# Patient Record
Sex: Male | Born: 1960
Health system: Southern US, Community
[De-identification: ages and names within clinical notes are randomized; demographics above are authoritative.]

## PROBLEM LIST (undated history)

## (undated) DIAGNOSIS — M5020 Other cervical disc displacement, unspecified cervical region: Secondary | ICD-10-CM

## (undated) DIAGNOSIS — M5126 Other intervertebral disc displacement, lumbar region: Secondary | ICD-10-CM

## (undated) DIAGNOSIS — S060X9A Concussion with loss of consciousness of unspecified duration, initial encounter: Secondary | ICD-10-CM

## (undated) HISTORY — DX: Other intervertebral disc displacement, lumbar region: M51.26

## (undated) HISTORY — PX: ROTATOR CUFF REPAIR: SHX139

## (undated) HISTORY — DX: Concussion with loss of consciousness of unspecified duration, initial encounter: S06.0X9A

## (undated) HISTORY — PX: ESOPHAGOGASTRODUODENOSCOPY: SHX1529

## (undated) HISTORY — DX: Other cervical disc displacement, unspecified cervical region: M50.20

## (undated) HISTORY — PX: COLONOSCOPY: SHX174

---

## 2012-06-09 DIAGNOSIS — S060XAA Concussion with loss of consciousness status unknown, initial encounter: Secondary | ICD-10-CM

## 2012-06-09 DIAGNOSIS — S060X9A Concussion with loss of consciousness of unspecified duration, initial encounter: Secondary | ICD-10-CM

## 2012-06-09 HISTORY — DX: Concussion with loss of consciousness status unknown, initial encounter: S06.0XAA

## 2012-06-09 HISTORY — DX: Concussion with loss of consciousness of unspecified duration, initial encounter: S06.0X9A

## 2017-07-03 DIAGNOSIS — Z6832 Body mass index (BMI) 32.0-32.9, adult: Secondary | ICD-10-CM | POA: Diagnosis not present

## 2017-07-03 DIAGNOSIS — R109 Unspecified abdominal pain: Secondary | ICD-10-CM | POA: Diagnosis not present

## 2017-07-03 DIAGNOSIS — K5900 Constipation, unspecified: Secondary | ICD-10-CM | POA: Diagnosis not present

## 2017-07-13 DIAGNOSIS — R7301 Impaired fasting glucose: Secondary | ICD-10-CM | POA: Diagnosis not present

## 2017-07-13 DIAGNOSIS — Z125 Encounter for screening for malignant neoplasm of prostate: Secondary | ICD-10-CM | POA: Diagnosis not present

## 2017-07-13 DIAGNOSIS — R109 Unspecified abdominal pain: Secondary | ICD-10-CM | POA: Diagnosis not present

## 2017-07-13 DIAGNOSIS — Z6832 Body mass index (BMI) 32.0-32.9, adult: Secondary | ICD-10-CM | POA: Diagnosis not present

## 2017-07-21 DIAGNOSIS — R109 Unspecified abdominal pain: Secondary | ICD-10-CM | POA: Diagnosis not present

## 2017-07-21 DIAGNOSIS — R197 Diarrhea, unspecified: Secondary | ICD-10-CM | POA: Diagnosis not present

## 2017-07-27 DIAGNOSIS — R933 Abnormal findings on diagnostic imaging of other parts of digestive tract: Secondary | ICD-10-CM | POA: Diagnosis not present

## 2017-07-27 DIAGNOSIS — N4289 Other specified disorders of prostate: Secondary | ICD-10-CM | POA: Diagnosis not present

## 2017-07-27 DIAGNOSIS — R109 Unspecified abdominal pain: Secondary | ICD-10-CM | POA: Diagnosis not present

## 2017-07-27 DIAGNOSIS — Z6833 Body mass index (BMI) 33.0-33.9, adult: Secondary | ICD-10-CM | POA: Diagnosis not present

## 2017-08-10 ENCOUNTER — Ambulatory Visit (INDEPENDENT_AMBULATORY_CARE_PROVIDER_SITE_OTHER): Payer: Medicare HMO | Admitting: Nurse Practitioner

## 2017-08-10 ENCOUNTER — Encounter: Payer: Self-pay | Admitting: Nurse Practitioner

## 2017-08-10 VITALS — BP 124/78 | HR 72 | Ht 71.0 in | Wt 231.0 lb

## 2017-08-10 DIAGNOSIS — R12 Heartburn: Secondary | ICD-10-CM

## 2017-08-10 DIAGNOSIS — R140 Abdominal distension (gaseous): Secondary | ICD-10-CM

## 2017-08-10 DIAGNOSIS — K5900 Constipation, unspecified: Secondary | ICD-10-CM

## 2017-08-10 NOTE — Patient Instructions (Addendum)
If you are age 565 or older, your body mass index should be between 23-30. Your Body mass index is 32.22 kg/m. If this is out of the aforementioned range listed, please consider follow up with your Primary Care Provider.  If you are age 57 or younger, your body mass index should be between 19-25. Your Body mass index is 32.22 kg/m. If this is out of the aformentioned range listed, please consider follow up with your Primary Care Provider.   You have been given Gas and Bloating literature.  We are requesting  CT results from your primary care physician.  Please bring endo/colon reports with you to your next visit.  Follow up with Willette ClusterPaula Guenther, NP on August 26, 2017 at 9:30 am.  Thank you for choosing me and [facility].   Willette ClusterPaula Guenther, NP
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
## 2017-08-11 ENCOUNTER — Encounter: Payer: Self-pay | Admitting: Nurse Practitioner

## 2017-08-14 DIAGNOSIS — Z6832 Body mass index (BMI) 32.0-32.9, adult: Secondary | ICD-10-CM | POA: Diagnosis not present

## 2017-08-14 DIAGNOSIS — R040 Epistaxis: Secondary | ICD-10-CM | POA: Diagnosis not present

## 2017-08-14 DIAGNOSIS — J310 Chronic rhinitis: Secondary | ICD-10-CM | POA: Diagnosis not present

## 2017-08-26 ENCOUNTER — Encounter: Payer: Self-pay | Admitting: Nurse Practitioner

## 2017-08-26 ENCOUNTER — Ambulatory Visit (INDEPENDENT_AMBULATORY_CARE_PROVIDER_SITE_OTHER): Payer: Medicare HMO | Admitting: Nurse Practitioner

## 2017-08-26 VITALS — BP 122/82 | HR 67 | Ht 70.0 in | Wt 231.0 lb

## 2017-08-26 DIAGNOSIS — K5900 Constipation, unspecified: Secondary | ICD-10-CM | POA: Diagnosis not present

## 2017-08-26 DIAGNOSIS — R140 Abdominal distension (gaseous): Secondary | ICD-10-CM

## 2017-08-26 DIAGNOSIS — R1031 Right lower quadrant pain: Secondary | ICD-10-CM | POA: Diagnosis not present
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
## 2017-08-26 NOTE — Patient Instructions (Addendum)
If you are age 57 or older, your body mass index should be between 23-30. Your Body mass index is 33.15 kg/m. If this is out of the aforementioned range listed, please consider follow up with your Primary Care Provider.  If you are age 57 or younger, your body mass index should be between 19-25. Your Body mass index is 33.15 kg/m. If this is out of the aformentioned range listed, please consider follow up with your Primary Care Provider.   You will be due for a recall colonoscopy in 08/2022. We will send you a reminder in the mail when it gets closer to that time.   Increase your water intake to 8 glasses daily.  Thank you for choosing me and [facility].  Willette ClusterPaula Guenther, NP

## 2017-10-02 ENCOUNTER — Telehealth: Payer: Self-pay | Admitting: Nurse Practitioner

## 2017-10-02 ENCOUNTER — Encounter (HOSPITAL_COMMUNITY): Payer: Self-pay

## 2017-10-02 ENCOUNTER — Emergency Department (HOSPITAL_COMMUNITY)
Admission: EM | Admit: 2017-10-02 | Discharge: 2017-10-02 | Disposition: A | Discharge status: Home / Self Care | Payer: Medicare HMO | Attending: Emergency Medicine | Admitting: Emergency Medicine

## 2017-10-02 ENCOUNTER — Emergency Department (HOSPITAL_COMMUNITY): Payer: Medicare HMO

## 2017-10-02 DIAGNOSIS — R1010 Upper abdominal pain, unspecified: Secondary | ICD-10-CM | POA: Diagnosis not present

## 2017-10-02 DIAGNOSIS — Z79899 Other long term (current) drug therapy: Secondary | ICD-10-CM | POA: Insufficient documentation

## 2017-10-02 DIAGNOSIS — R1011 Right upper quadrant pain: Secondary | ICD-10-CM | POA: Insufficient documentation

## 2017-10-02 DIAGNOSIS — R197 Diarrhea, unspecified: Secondary | ICD-10-CM | POA: Diagnosis not present

## 2017-10-02 DIAGNOSIS — R140 Abdominal distension (gaseous): Secondary | ICD-10-CM | POA: Diagnosis not present

## 2017-10-02 LAB — URINALYSIS, ROUTINE W REFLEX MICROSCOPIC
BILIRUBIN URINE: NEGATIVE
Glucose, UA: NEGATIVE mg/dL
HGB URINE DIPSTICK: NEGATIVE
Ketones, ur: NEGATIVE mg/dL
Leukocytes, UA: NEGATIVE
Nitrite: NEGATIVE
Protein, ur: NEGATIVE mg/dL
Specific Gravity, Urine: 1.02 (ref 1.005–1.030)
pH: 5 (ref 5.0–8.0)

## 2017-10-02 LAB — COMPREHENSIVE METABOLIC PANEL
ALBUMIN: 4 g/dL (ref 3.5–5.0)
ALT: 32 U/L (ref 17–63)
AST: 25 U/L (ref 15–41)
Alkaline Phosphatase: 56 U/L (ref 38–126)
Anion gap: 8 (ref 5–15)
BUN: 13 mg/dL (ref 6–20)
CALCIUM: 9.6 mg/dL (ref 8.9–10.3)
CO2: 25 mmol/L (ref 22–32)
CREATININE: 1.04 mg/dL (ref 0.61–1.24)
Chloride: 107 mmol/L (ref 101–111)
GFR calc Af Amer: >60 mL/min (ref >60)
GFR calc non Af Amer: >60 mL/min (ref >60)
GLUCOSE: 101 mg/dL — AB (ref 65–99)
Potassium: 4.2 mmol/L (ref 3.5–5.1)
Sodium: 140 mmol/L (ref 135–145)
Total Bilirubin: 1 mg/dL (ref 0.3–1.2)
Total Protein: 7.2 g/dL (ref 6.5–8.1)

## 2017-10-02 LAB — CBC
HCT: 41.5 % (ref 39.0–52.0)
HEMOGLOBIN: 13.8 g/dL (ref 13.0–17.0)
MCH: 27.6 pg (ref 26.0–34.0)
MCHC: 33.3 g/dL (ref 30.0–36.0)
MCV: 83 fL (ref 78.0–100.0)
Platelets: 277 10*3/uL (ref 150–400)
RBC: 5 MIL/uL (ref 4.22–5.81)
RDW: 14.5 % (ref 11.5–15.5)
WBC: 6.4 10*3/uL (ref 4.0–10.5)

## 2017-10-02 LAB — LIPASE, BLOOD: LIPASE: 45 U/L (ref 11–51)

## 2017-10-02 NOTE — ED Notes (Signed)
Patient Alert and oriented to baseline. Stable and ambulatory to baseline. Patient verbalized understanding of the discharge instructions.  Patient belongings were taken by the patient.   

## 2017-10-02 NOTE — Discharge Instructions (Signed)
It was my pleasure taking care of you today!  ° °Fortunately, your lab work and imaging was very reassuring today.  °At this time, there does not appear to be an acute, emergent cause for your abdominal pain. However, this does not mean that your abdominal pain may not become an emergency in the future. It is VERY important that you monitor your symptoms and return to the Emergency Department if you develop any of the following symptoms:  °You have a fever.  °You keep throwing up and can't keep fluids down.  °You pass bloody or black tarry stools.  °There is bright red blood in the stool. °You do not seem to be getting better.  °You have any questions or concerns.  °

## 2017-10-02 NOTE — ED Notes (Signed)
Patient transported to [facility] 

## 2017-10-02 NOTE — ED Triage Notes (Signed)
Patient here with a few months of right sided abdominal pain with change in stool habits, reports intermittent pain and diarrhea. No nausea, no vomiting

## 2017-10-02 NOTE — ED Notes (Addendum)
Pt returned from [facility]. No acute distress noted.

## 2017-10-02 NOTE — Telephone Encounter (Signed)
Patient is currently in the ED.
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
## 2017-10-13 DIAGNOSIS — R109 Unspecified abdominal pain: Secondary | ICD-10-CM | POA: Diagnosis not present

## 2017-10-13 DIAGNOSIS — Z6832 Body mass index (BMI) 32.0-32.9, adult: Secondary | ICD-10-CM | POA: Diagnosis not present

## 2017-11-04 ENCOUNTER — Telehealth: Payer: Self-pay

## 2017-11-04 NOTE — Telephone Encounter (Signed)
Forwarded to a phone note. gm

## 2017-11-04 NOTE — Telephone Encounter (Signed)
-----   Message from Meredith Pel, NP sent at 11/03/2017 10:50 AM EDT ----- Regarding: FW: Patient of yours seen in ED today. Malachi Bonds, can you turn this note from ED into a phone note. Thanks ----- Message ----- From: Janyth Contes, PA-C Sent: 10/02/2017   8:34 PM To: Meredith Pel, NP Subject: Patient of yours seen in ED today.             Hello!   Just wanted to let you know that I saw this patient of yours in the ER today for continued symptoms without any acute changes other than one white stool. He was pretty convinced this was due to his gallbladder, so I did get an ultrasound and gallbladder was fine. Asked him to follow up with you guys in clinic. He wanted me to pass along the message that he had been seen in ED.   Thanks,   Elizabeth Sauer, PA-C  Emergency Medicine

## 2017-12-28 DIAGNOSIS — R079 Chest pain, unspecified: Secondary | ICD-10-CM | POA: Diagnosis not present

## 2017-12-28 DIAGNOSIS — Z6833 Body mass index (BMI) 33.0-33.9, adult: Secondary | ICD-10-CM | POA: Diagnosis not present

## 2017-12-29 DIAGNOSIS — R079 Chest pain, unspecified: Secondary | ICD-10-CM | POA: Diagnosis not present

## 2018-01-06 DIAGNOSIS — I493 Ventricular premature depolarization: Secondary | ICD-10-CM | POA: Diagnosis not present

## 2018-01-06 DIAGNOSIS — Z13220 Encounter for screening for lipoid disorders: Secondary | ICD-10-CM | POA: Diagnosis not present

## 2018-01-06 DIAGNOSIS — Z6832 Body mass index (BMI) 32.0-32.9, adult: Secondary | ICD-10-CM | POA: Diagnosis not present

## 2018-01-06 DIAGNOSIS — R079 Chest pain, unspecified: Secondary | ICD-10-CM | POA: Diagnosis not present

## 2018-01-13 DIAGNOSIS — I201 Angina pectoris with documented spasm: Secondary | ICD-10-CM | POA: Diagnosis not present

## 2018-01-14 DIAGNOSIS — R079 Chest pain, unspecified: Secondary | ICD-10-CM | POA: Diagnosis not present

## 2018-02-05 DIAGNOSIS — Z6832 Body mass index (BMI) 32.0-32.9, adult: Secondary | ICD-10-CM | POA: Diagnosis not present

## 2018-02-05 DIAGNOSIS — M5003 Cervical disc disorder with myelopathy, cervicothoracic region: Secondary | ICD-10-CM | POA: Diagnosis not present

## 2018-02-05 DIAGNOSIS — I471 Supraventricular tachycardia: Secondary | ICD-10-CM | POA: Diagnosis not present

## 2018-02-05 DIAGNOSIS — M5020 Other cervical disc displacement, unspecified cervical region: Secondary | ICD-10-CM | POA: Diagnosis not present

## 2018-03-05 DIAGNOSIS — J309 Allergic rhinitis, unspecified: Secondary | ICD-10-CM | POA: Diagnosis not present

## 2018-03-05 DIAGNOSIS — I471 Supraventricular tachycardia: Secondary | ICD-10-CM | POA: Diagnosis not present

## 2018-03-05 DIAGNOSIS — Z6832 Body mass index (BMI) 32.0-32.9, adult: Secondary | ICD-10-CM | POA: Diagnosis not present

## 2018-03-25 DIAGNOSIS — N401 Enlarged prostate with lower urinary tract symptoms: Secondary | ICD-10-CM | POA: Diagnosis not present

## 2018-03-25 DIAGNOSIS — N4283 Cyst of prostate: Secondary | ICD-10-CM | POA: Diagnosis not present

## 2018-04-15 DIAGNOSIS — J019 Acute sinusitis, unspecified: Secondary | ICD-10-CM | POA: Diagnosis not present

## 2018-06-09 DIAGNOSIS — H10029 Other mucopurulent conjunctivitis, unspecified eye: Secondary | ICD-10-CM | POA: Diagnosis not present

## 2018-07-06 DIAGNOSIS — J019 Acute sinusitis, unspecified: Secondary | ICD-10-CM | POA: Diagnosis not present

## 2018-07-06 DIAGNOSIS — Z6832 Body mass index (BMI) 32.0-32.9, adult: Secondary | ICD-10-CM | POA: Diagnosis not present

## 2018-07-06 DIAGNOSIS — J069 Acute upper respiratory infection, unspecified: Secondary | ICD-10-CM | POA: Diagnosis not present

## 2018-07-06 DIAGNOSIS — R05 Cough: Secondary | ICD-10-CM | POA: Diagnosis not present

## 2018-08-06 DIAGNOSIS — M549 Dorsalgia, unspecified: Secondary | ICD-10-CM | POA: Diagnosis not present

## 2018-08-06 DIAGNOSIS — Z1331 Encounter for screening for depression: Secondary | ICD-10-CM | POA: Diagnosis not present

## 2018-08-06 DIAGNOSIS — M542 Cervicalgia: Secondary | ICD-10-CM | POA: Diagnosis not present

## 2018-08-06 DIAGNOSIS — G8929 Other chronic pain: Secondary | ICD-10-CM | POA: Diagnosis not present

## 2018-11-26 DIAGNOSIS — Z6833 Body mass index (BMI) 33.0-33.9, adult: Secondary | ICD-10-CM | POA: Diagnosis not present

## 2018-11-26 DIAGNOSIS — W57XXXA Bitten or stung by nonvenomous insect and other nonvenomous arthropods, initial encounter: Secondary | ICD-10-CM | POA: Diagnosis not present

## 2018-11-26 DIAGNOSIS — M79605 Pain in left leg: Secondary | ICD-10-CM | POA: Diagnosis not present

## 2019-01-11 ENCOUNTER — Encounter: Payer: Self-pay | Admitting: Nurse Practitioner

## 2019-01-11 ENCOUNTER — Ambulatory Visit (INDEPENDENT_AMBULATORY_CARE_PROVIDER_SITE_OTHER): Payer: Medicare HMO | Admitting: Nurse Practitioner

## 2019-01-11 VITALS — BP 110/70 | HR 85 | Ht 71.0 in | Wt 238.0 lb

## 2019-01-11 DIAGNOSIS — K5900 Constipation, unspecified: Secondary | ICD-10-CM | POA: Diagnosis not present

## 2019-01-11 DIAGNOSIS — R109 Unspecified abdominal pain: Secondary | ICD-10-CM | POA: Diagnosis not present

## 2019-01-11 DIAGNOSIS — R140 Abdominal distension (gaseous): Secondary | ICD-10-CM

## 2019-01-11 DIAGNOSIS — R5383 Other fatigue: Secondary | ICD-10-CM | POA: Diagnosis not present

## 2019-01-11 MED ORDER — NA SULFATE-K SULFATE-MG SULF 17.5-3.13-1.6 GM/177ML PO SOLN: 1.0000 | Freq: Once | ORAL | 0 refills | Status: AC
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
## 2019-01-11 NOTE — Patient Instructions (Addendum)
If you are age 58 or older, your body mass index should be between 23-30. Your Body mass index is 33.19 kg/m. If this is out of the aforementioned range listed, please consider follow up with your Primary Care Provider.  If you are age 72 or younger, your body mass index should be between 19-25. Your Body mass index is 33.19 kg/m. If this is out of the aformentioned range listed, please consider follow up with your Primary Care Provider.   Increase your water intake to 48-64 ounces a day, take metamucil daily. You have been scheduled for a colonoscopy. Please follow written instructions given to you at your visit today.  Please pick up your prep supplies at the pharmacy within the next 1-3 days. If you use inhalers (even only as needed), please bring them with you on the day of your procedure.  We have sent the following medications to your pharmacy for you to pick up at your convenience:  Suprep  Hold your iron supplement 5 days prior to your procedure.  Thank you for choosing me and [facility].  Tye Savoy, NP

## 2019-01-14 ENCOUNTER — Telehealth: Payer: Self-pay | Admitting: Internal Medicine

## 2019-01-14 NOTE — Telephone Encounter (Signed)
Noted  

## 2019-01-14 NOTE — Telephone Encounter (Signed)
FYI: [facility] called and said patient canceled his procedure because he cannot afford right now.  His insurance will not cover the procedure and he would like to try the meds first

## 2019-01-27 ENCOUNTER — Encounter: Payer: Medicare HMO | Admitting: Internal Medicine

## 2019-02-08 ENCOUNTER — Emergency Department (HOSPITAL_COMMUNITY)
Admission: EM | Admit: 2019-02-08 | Discharge: 2019-02-09 | Disposition: A | Discharge status: Home / Self Care | Payer: Medicare HMO | Attending: Emergency Medicine | Admitting: Emergency Medicine

## 2019-02-08 ENCOUNTER — Other Ambulatory Visit: Payer: Self-pay

## 2019-02-08 DIAGNOSIS — R42 Dizziness and giddiness: Secondary | ICD-10-CM | POA: Diagnosis not present

## 2019-02-08 DIAGNOSIS — R55 Syncope and collapse: Secondary | ICD-10-CM | POA: Diagnosis not present

## 2019-02-08 LAB — DIFFERENTIAL
Abs Immature Granulocytes: 0.03 10*3/uL (ref 0.00–0.07)
Basophils Absolute: 0 10*3/uL (ref 0.0–0.1)
Basophils Relative: 0 %
Eosinophils Absolute: 0.3 10*3/uL (ref 0.0–0.5)
Eosinophils Relative: 3 %
Immature Granulocytes: 0 %
Lymphocytes Relative: 21 %
Lymphs Abs: 1.9 10*3/uL (ref 0.7–4.0)
Monocytes Absolute: 0.7 10*3/uL (ref 0.1–1.0)
Monocytes Relative: 7 %
Neutro Abs: 6 10*3/uL (ref 1.7–7.7)
Neutrophils Relative %: 69 %

## 2019-02-08 LAB — I-STAT CHEM 8, ED
BUN: 16 mg/dL (ref 6–20)
Calcium, Ion: 0.98 mmol/L — ABNORMAL LOW (ref 1.15–1.40)
Chloride: 107 mmol/L (ref 98–111)
Creatinine, Ser: 1 mg/dL (ref 0.61–1.24)
Glucose, Bld: 122 mg/dL — ABNORMAL HIGH (ref 70–99)
HCT: 48 % (ref 39.0–52.0)
Hemoglobin: 16.3 g/dL (ref 13.0–17.0)
Potassium: 4.1 mmol/L (ref 3.5–5.1)
Sodium: 138 mmol/L (ref 135–145)
TCO2: 21 mmol/L — ABNORMAL LOW (ref 22–32)

## 2019-02-08 LAB — COMPREHENSIVE METABOLIC PANEL
ALT: 38 U/L (ref 0–44)
AST: 29 U/L (ref 15–41)
Albumin: 4.5 g/dL (ref 3.5–5.0)
Alkaline Phosphatase: 54 U/L (ref 38–126)
Anion gap: 11 (ref 5–15)
BUN: 13 mg/dL (ref 6–20)
CO2: 24 mmol/L (ref 22–32)
Calcium: 10.1 mg/dL (ref 8.9–10.3)
Chloride: 105 mmol/L (ref 98–111)
Creatinine, Ser: 1.04 mg/dL (ref 0.61–1.24)
GFR calc Af Amer: >60 mL/min (ref >60)
GFR calc non Af Amer: >60 mL/min (ref >60)
Glucose, Bld: 123 mg/dL — ABNORMAL HIGH (ref 70–99)
Potassium: 4.3 mmol/L (ref 3.5–5.1)
Sodium: 140 mmol/L (ref 135–145)
Total Bilirubin: 1.5 mg/dL — ABNORMAL HIGH (ref 0.3–1.2)
Total Protein: 8.1 g/dL (ref 6.5–8.1)

## 2019-02-08 LAB — CBC
HCT: 45.2 % (ref 39.0–52.0)
Hemoglobin: 14.7 g/dL (ref 13.0–17.0)
MCH: 27.6 pg (ref 26.0–34.0)
MCHC: 32.5 g/dL (ref 30.0–36.0)
MCV: 84.8 fL (ref 80.0–100.0)
Platelets: 244 10*3/uL (ref 150–400)
RBC: 5.33 MIL/uL (ref 4.22–5.81)
RDW: 14 % (ref 11.5–15.5)
WBC: 8.9 10*3/uL (ref 4.0–10.5)
nRBC: 0 % (ref 0.0–0.2)

## 2019-02-08 LAB — APTT: aPTT: 27 seconds (ref 24–36)

## 2019-02-08 LAB — PROTIME-INR
INR: 1.1 (ref 0.8–1.2)
Prothrombin Time: 13.7 seconds (ref 11.4–15.2)

## 2019-02-08 MED ORDER — SODIUM CHLORIDE 0.9% FLUSH
3.0000 mL | Freq: Once | INTRAVENOUS | Status: AC
Start: 2019-02-08 — End: 2019-02-09
  Administered 2019-02-09: 3 mL via INTRAVENOUS

## 2019-02-08 NOTE — ED Triage Notes (Signed)
To ED for eval of dizziness since last pm. Dizziness happens when pt lays down or looks to the ground. Pt was able to drive self to the hospital. States this am when having BM he had episode of vomiting. Denies sob or cp.

## 2019-02-09 MED ORDER — LACTATED RINGERS IV BOLUS
1000.0000 mL | Freq: Once | INTRAVENOUS | Status: AC
  Administered 2019-02-09: 1000 mL via INTRAVENOUS

## 2019-02-09 MED ORDER — MECLIZINE HCL 25 MG PO TABS: 25.0000 mg | ORAL_TABLET | Freq: Three times a day (TID) | ORAL | 0 refills | Status: DC | PRN

## 2019-02-09 MED ORDER — MECLIZINE HCL 25 MG PO TABS
25.0000 mg | ORAL_TABLET | Freq: Once | ORAL | Status: AC
Start: 2019-02-09 — End: 2019-02-09
  Administered 2019-02-09: 25 mg via ORAL
  Filled 2019-02-09: qty 1

## 2019-02-09 MED ORDER — PREDNISONE 20 MG PO TABS
60.0000 mg | ORAL_TABLET | Freq: Once | ORAL | Status: AC
  Administered 2019-02-09: 60 mg via ORAL
  Filled 2019-02-09: qty 3

## 2019-02-09 MED ORDER — PREDNISONE 20 MG PO TABS: ORAL_TABLET | ORAL | 0 refills | Status: DC

## 2019-02-09 NOTE — ED Notes (Signed)
Family at bedside. 
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
## 2019-03-25 DIAGNOSIS — Z1339 Encounter for screening examination for other mental health and behavioral disorders: Secondary | ICD-10-CM | POA: Diagnosis not present

## 2019-03-25 DIAGNOSIS — Z6833 Body mass index (BMI) 33.0-33.9, adult: Secondary | ICD-10-CM | POA: Diagnosis not present

## 2019-03-25 DIAGNOSIS — Z7189 Other specified counseling: Secondary | ICD-10-CM | POA: Diagnosis not present

## 2019-03-25 DIAGNOSIS — Z0000 Encounter for general adult medical examination without abnormal findings: Secondary | ICD-10-CM | POA: Diagnosis not present

## 2019-03-25 DIAGNOSIS — Z1211 Encounter for screening for malignant neoplasm of colon: Secondary | ICD-10-CM | POA: Diagnosis not present

## 2019-03-25 DIAGNOSIS — Z139 Encounter for screening, unspecified: Secondary | ICD-10-CM | POA: Diagnosis not present

## 2019-03-25 DIAGNOSIS — Z1331 Encounter for screening for depression: Secondary | ICD-10-CM | POA: Diagnosis not present

## 2019-03-25 DIAGNOSIS — Z125 Encounter for screening for malignant neoplasm of prostate: Secondary | ICD-10-CM | POA: Diagnosis not present

## 2019-03-25 DIAGNOSIS — Z13220 Encounter for screening for lipoid disorders: Secondary | ICD-10-CM | POA: Diagnosis not present

## 2019-03-25 DIAGNOSIS — Z131 Encounter for screening for diabetes mellitus: Secondary | ICD-10-CM | POA: Diagnosis not present

## 2019-04-08 DIAGNOSIS — R7303 Prediabetes: Secondary | ICD-10-CM | POA: Diagnosis not present

## 2019-04-08 DIAGNOSIS — Z6833 Body mass index (BMI) 33.0-33.9, adult: Secondary | ICD-10-CM | POA: Diagnosis not present

## 2019-04-08 DIAGNOSIS — R7301 Impaired fasting glucose: Secondary | ICD-10-CM | POA: Diagnosis not present

## 2019-04-08 DIAGNOSIS — E785 Hyperlipidemia, unspecified: Secondary | ICD-10-CM | POA: Diagnosis not present

## 2019-04-12 DIAGNOSIS — R7303 Prediabetes: Secondary | ICD-10-CM | POA: Diagnosis not present

## 2019-04-24 ENCOUNTER — Other Ambulatory Visit: Payer: Self-pay | Admitting: Cardiology

## 2019-04-24 DIAGNOSIS — Z20822 Contact with and (suspected) exposure to covid-19: Secondary | ICD-10-CM

## 2019-04-26 DIAGNOSIS — Z6833 Body mass index (BMI) 33.0-33.9, adult: Secondary | ICD-10-CM | POA: Diagnosis not present

## 2019-04-26 DIAGNOSIS — Z6832 Body mass index (BMI) 32.0-32.9, adult: Secondary | ICD-10-CM | POA: Diagnosis not present

## 2019-04-26 LAB — NOVEL CORONAVIRUS, NAA: SARS-CoV-2, NAA: NOT DETECTED

## 2019-05-03 DIAGNOSIS — Z6832 Body mass index (BMI) 32.0-32.9, adult: Secondary | ICD-10-CM | POA: Diagnosis not present

## 2019-05-17 DIAGNOSIS — E669 Obesity, unspecified: Secondary | ICD-10-CM | POA: Diagnosis not present

## 2019-05-31 DIAGNOSIS — E785 Hyperlipidemia, unspecified: Secondary | ICD-10-CM | POA: Diagnosis not present

## 2019-05-31 DIAGNOSIS — Z6832 Body mass index (BMI) 32.0-32.9, adult: Secondary | ICD-10-CM | POA: Diagnosis not present

## 2019-05-31 DIAGNOSIS — E669 Obesity, unspecified: Secondary | ICD-10-CM | POA: Diagnosis not present

## 2019-05-31 DIAGNOSIS — R7303 Prediabetes: Secondary | ICD-10-CM | POA: Diagnosis not present

## 2019-06-28 DIAGNOSIS — Z6832 Body mass index (BMI) 32.0-32.9, adult: Secondary | ICD-10-CM | POA: Diagnosis not present

## 2019-06-28 DIAGNOSIS — E669 Obesity, unspecified: Secondary | ICD-10-CM | POA: Diagnosis not present

## 2019-07-12 DIAGNOSIS — E669 Obesity, unspecified: Secondary | ICD-10-CM | POA: Diagnosis not present

## 2019-07-19 DIAGNOSIS — H5203 Hypermetropia, bilateral: Secondary | ICD-10-CM | POA: Diagnosis not present

## 2019-07-19 DIAGNOSIS — H52209 Unspecified astigmatism, unspecified eye: Secondary | ICD-10-CM | POA: Diagnosis not present

## 2019-07-19 DIAGNOSIS — H524 Presbyopia: Secondary | ICD-10-CM | POA: Diagnosis not present

## 2019-08-12 DIAGNOSIS — R0789 Other chest pain: Secondary | ICD-10-CM | POA: Diagnosis not present

## 2019-09-27 DIAGNOSIS — R519 Headache, unspecified: Secondary | ICD-10-CM | POA: Diagnosis not present

## 2019-09-27 DIAGNOSIS — R509 Fever, unspecified: Secondary | ICD-10-CM | POA: Diagnosis not present

## 2019-09-27 DIAGNOSIS — M791 Myalgia, unspecified site: Secondary | ICD-10-CM | POA: Diagnosis not present

## 2019-09-29 DIAGNOSIS — U071 COVID-19: Secondary | ICD-10-CM

## 2019-09-29 HISTORY — DX: COVID-19: U07.1

## 2019-10-04 ENCOUNTER — Emergency Department (HOSPITAL_COMMUNITY): Payer: Medicare Other

## 2019-10-04 ENCOUNTER — Inpatient Hospital Stay (HOSPITAL_COMMUNITY)
Admission: EM | Admit: 2019-10-04 | Discharge: 2019-10-09 | DRG: 177 | Disposition: A | Discharge status: Home / Self Care | Payer: Medicare Other | Attending: Internal Medicine | Admitting: Internal Medicine

## 2019-10-04 ENCOUNTER — Other Ambulatory Visit: Payer: Self-pay

## 2019-10-04 DIAGNOSIS — Z8371 Family history of colonic polyps: Secondary | ICD-10-CM | POA: Diagnosis not present

## 2019-10-04 DIAGNOSIS — Z87820 Personal history of traumatic brain injury: Secondary | ICD-10-CM

## 2019-10-04 DIAGNOSIS — Z8249 Family history of ischemic heart disease and other diseases of the circulatory system: Secondary | ICD-10-CM | POA: Diagnosis not present

## 2019-10-04 DIAGNOSIS — I959 Hypotension, unspecified: Secondary | ICD-10-CM | POA: Diagnosis present

## 2019-10-04 DIAGNOSIS — Z791 Long term (current) use of non-steroidal anti-inflammatories (NSAID): Secondary | ICD-10-CM

## 2019-10-04 DIAGNOSIS — Z8042 Family history of malignant neoplasm of prostate: Secondary | ICD-10-CM

## 2019-10-04 DIAGNOSIS — R0602 Shortness of breath: Secondary | ICD-10-CM | POA: Diagnosis not present

## 2019-10-04 DIAGNOSIS — U071 COVID-19: Secondary | ICD-10-CM | POA: Diagnosis not present

## 2019-10-04 DIAGNOSIS — Z79899 Other long term (current) drug therapy: Secondary | ICD-10-CM

## 2019-10-04 DIAGNOSIS — J189 Pneumonia, unspecified organism: Secondary | ICD-10-CM

## 2019-10-04 DIAGNOSIS — G4733 Obstructive sleep apnea (adult) (pediatric): Secondary | ICD-10-CM | POA: Diagnosis present

## 2019-10-04 DIAGNOSIS — Z9119 Patient's noncompliance with other medical treatment and regimen: Secondary | ICD-10-CM | POA: Diagnosis not present

## 2019-10-04 DIAGNOSIS — Z7952 Long term (current) use of systemic steroids: Secondary | ICD-10-CM | POA: Diagnosis not present

## 2019-10-04 DIAGNOSIS — Z87891 Personal history of nicotine dependence: Secondary | ICD-10-CM

## 2019-10-04 DIAGNOSIS — J1282 Pneumonia due to coronavirus disease 2019: Secondary | ICD-10-CM | POA: Diagnosis not present

## 2019-10-04 DIAGNOSIS — Z880 Allergy status to penicillin: Secondary | ICD-10-CM

## 2019-10-04 DIAGNOSIS — E861 Hypovolemia: Secondary | ICD-10-CM | POA: Diagnosis present

## 2019-10-04 DIAGNOSIS — R197 Diarrhea, unspecified: Secondary | ICD-10-CM | POA: Diagnosis not present

## 2019-10-04 NOTE — ED Triage Notes (Signed)
Was around someone on 4-4 that was Covid +, a few days later pt started getting sinus symptoms and then started feeling worse.  Has been taking herbal remedies but feels he needs antibiotic for pneumonia.  Shortness of breath is worsening.

## 2019-10-05 ENCOUNTER — Encounter (HOSPITAL_COMMUNITY): Payer: Self-pay | Admitting: Internal Medicine

## 2019-10-05 ENCOUNTER — Other Ambulatory Visit: Payer: Self-pay

## 2019-10-05 ENCOUNTER — Emergency Department (HOSPITAL_COMMUNITY): Payer: Medicare Other

## 2019-10-05 DIAGNOSIS — J1282 Pneumonia due to coronavirus disease 2019: Secondary | ICD-10-CM | POA: Diagnosis present

## 2019-10-05 DIAGNOSIS — U071 COVID-19: Secondary | ICD-10-CM | POA: Diagnosis present

## 2019-10-05 LAB — FIBRINOGEN: Fibrinogen: 632 mg/dL — ABNORMAL HIGH (ref 210–475)

## 2019-10-05 LAB — CBC
HCT: 44.2 % (ref 39.0–52.0)
Hemoglobin: 14.2 g/dL (ref 13.0–17.0)
MCH: 26.7 pg (ref 26.0–34.0)
MCHC: 32.1 g/dL (ref 30.0–36.0)
MCV: 83.2 fL (ref 80.0–100.0)
Platelets: 221 10*3/uL (ref 150–400)
RBC: 5.31 MIL/uL (ref 4.22–5.81)
RDW: 14 % (ref 11.5–15.5)
WBC: 4.1 10*3/uL (ref 4.0–10.5)
nRBC: 0 % (ref 0.0–0.2)

## 2019-10-05 LAB — RESPIRATORY PANEL BY RT PCR (FLU A&B, COVID)
Influenza A by PCR: NEGATIVE
Influenza B by PCR: NEGATIVE
SARS Coronavirus 2 by RT PCR: POSITIVE — AB

## 2019-10-05 LAB — BASIC METABOLIC PANEL
Anion gap: 11 (ref 5–15)
Anion gap: 11 (ref 5–15)
BUN: 12 mg/dL (ref 6–20)
BUN: 13 mg/dL (ref 6–20)
CO2: 24 mmol/L (ref 22–32)
CO2: 25 mmol/L (ref 22–32)
Calcium: 8.7 mg/dL — ABNORMAL LOW (ref 8.9–10.3)
Calcium: 9.3 mg/dL (ref 8.9–10.3)
Chloride: 102 mmol/L (ref 98–111)
Chloride: 103 mmol/L (ref 98–111)
Creatinine, Ser: 1.03 mg/dL (ref 0.61–1.24)
Creatinine, Ser: 1.16 mg/dL (ref 0.61–1.24)
GFR calc Af Amer: >60 mL/min (ref >60)
GFR calc Af Amer: >60 mL/min (ref >60)
GFR calc non Af Amer: >60 mL/min (ref >60)
GFR calc non Af Amer: >60 mL/min (ref >60)
Glucose, Bld: 115 mg/dL — ABNORMAL HIGH (ref 70–99)
Glucose, Bld: 128 mg/dL — ABNORMAL HIGH (ref 70–99)
Potassium: 4.2 mmol/L (ref 3.5–5.1)
Potassium: 4.7 mmol/L (ref 3.5–5.1)
Sodium: 138 mmol/L (ref 135–145)
Sodium: 138 mmol/L (ref 135–145)

## 2019-10-05 LAB — LACTIC ACID, PLASMA
Lactic Acid, Venous: 1.4 mmol/L (ref 0.5–1.9)
Lactic Acid, Venous: 2.7 mmol/L (ref 0.5–1.9)

## 2019-10-05 LAB — CBG MONITORING, ED: Glucose-Capillary: 93 mg/dL (ref 70–99)

## 2019-10-05 LAB — C-REACTIVE PROTEIN: CRP: 4.8 mg/dL — ABNORMAL HIGH (ref <1.0)

## 2019-10-05 LAB — PROCALCITONIN: Procalcitonin: <0.1 ng/mL

## 2019-10-05 LAB — HIV ANTIBODY (ROUTINE TESTING W REFLEX): HIV Screen 4th Generation wRfx: NONREACTIVE

## 2019-10-05 LAB — LACTATE DEHYDROGENASE: LDH: 419 U/L — ABNORMAL HIGH (ref 98–192)

## 2019-10-05 LAB — FERRITIN: Ferritin: 1093 ng/mL — ABNORMAL HIGH (ref 24–336)

## 2019-10-05 LAB — TRIGLYCERIDES: Triglycerides: 132 mg/dL (ref <150)

## 2019-10-05 LAB — D-DIMER, QUANTITATIVE: D-Dimer, Quant: 1.09 ug/mL-FEU — ABNORMAL HIGH (ref 0.00–0.50)

## 2019-10-05 MED ORDER — ENOXAPARIN SODIUM 40 MG/0.4ML ~~LOC~~ SOLN
40.0000 mg | SUBCUTANEOUS | Status: DC
  Administered 2019-10-05 – 2019-10-08 (×4): 40 mg via SUBCUTANEOUS
  Filled 2019-10-05 (×4): qty 0.4

## 2019-10-05 MED ORDER — SENNOSIDES-DOCUSATE SODIUM 8.6-50 MG PO TABS: 1.0000 | ORAL_TABLET | Freq: Every evening | ORAL | Status: DC | PRN

## 2019-10-05 MED ORDER — ENOXAPARIN SODIUM 40 MG/0.4ML ~~LOC~~ SOLN: 40.0000 mg | SUBCUTANEOUS | Status: DC

## 2019-10-05 MED ORDER — SODIUM CHLORIDE 0.9 % IV SOLN: INTRAVENOUS | Status: DC | PRN

## 2019-10-05 MED ORDER — GUAIFENESIN-DM 100-10 MG/5ML PO SYRP
10.0000 mL | ORAL_SOLUTION | ORAL | Status: DC | PRN
  Filled 2019-10-05: qty 10

## 2019-10-05 MED ORDER — ACETAMINOPHEN 650 MG RE SUPP: 650.0000 mg | Freq: Four times a day (QID) | RECTAL | Status: DC | PRN

## 2019-10-05 MED ORDER — SODIUM CHLORIDE 0.9 % IV SOLN
200.0000 mg | Freq: Once | INTRAVENOUS | Status: DC
  Filled 2019-10-05: qty 40

## 2019-10-05 MED ORDER — SODIUM CHLORIDE 0.9 % IV BOLUS
1000.0000 mL | Freq: Once | INTRAVENOUS | Status: AC
  Administered 2019-10-05: 1000 mL via INTRAVENOUS

## 2019-10-05 MED ORDER — PROMETHAZINE HCL 25 MG PO TABS
12.5000 mg | ORAL_TABLET | Freq: Four times a day (QID) | ORAL | Status: DC | PRN
  Administered 2019-10-06: 12.5 mg via ORAL
  Filled 2019-10-05 (×2): qty 1

## 2019-10-05 MED ORDER — PROMETHAZINE HCL 25 MG PO TABS: 12.5000 mg | ORAL_TABLET | Freq: Four times a day (QID) | ORAL | Status: DC | PRN

## 2019-10-05 MED ORDER — ACETAMINOPHEN 325 MG PO TABS: 650.0000 mg | ORAL_TABLET | Freq: Four times a day (QID) | ORAL | Status: DC | PRN

## 2019-10-05 MED ORDER — ACETAMINOPHEN 325 MG PO TABS
650.0000 mg | ORAL_TABLET | Freq: Four times a day (QID) | ORAL | Status: DC | PRN
  Administered 2019-10-06: 650 mg via ORAL
  Filled 2019-10-05: qty 2

## 2019-10-05 MED ORDER — HYDROCOD POLST-CPM POLST ER 10-8 MG/5ML PO SUER
5.0000 mL | Freq: Two times a day (BID) | ORAL | Status: DC | PRN
  Filled 2019-10-05: qty 5

## 2019-10-05 MED ORDER — SODIUM CHLORIDE 0.9 % IV SOLN
200.0000 mg | Freq: Once | INTRAVENOUS | Status: AC
  Administered 2019-10-05: 200 mg via INTRAVENOUS
  Filled 2019-10-05: qty 40

## 2019-10-05 MED ORDER — GUAIFENESIN-DM 100-10 MG/5ML PO SYRP: 10.0000 mL | ORAL_SOLUTION | ORAL | Status: DC | PRN

## 2019-10-05 MED ORDER — HYDROCOD POLST-CPM POLST ER 10-8 MG/5ML PO SUER: 5.0000 mL | Freq: Two times a day (BID) | ORAL | Status: DC | PRN

## 2019-10-05 MED ORDER — SODIUM CHLORIDE 0.9 % IV SOLN: 100.0000 mg | Freq: Every day | INTRAVENOUS | Status: DC
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
## 2019-10-05 NOTE — ED Notes (Signed)
Pt complaining of suddenly feeling weak and nauseated. Sates that sometimes his sugar drops.

## 2019-10-05 NOTE — ED Notes (Signed)
POt ambulated on room air, pt felt somewhat short of breath upon returning to room. Sats remained in low 90's upper 80's. Upon returning to bed pts sats were 88% on room air. Josh PA notified.

## 2019-10-05 NOTE — ED Notes (Signed)
Just while getting pt up from wheelchair and undressed pt was short of breath. Pt was 89% on the monitor and after taking some deep breathes in the bed he increased to 93%.
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
## 2019-10-05 NOTE — ED Notes (Signed)
CBG 93 

## 2019-10-05 NOTE — Progress Notes (Signed)
CRITICAL VALUE ALERT  Critical Value:  Lactic Acid 2.7  Date & Time Notied:  10/05/2019 at 2059  Provider Notified: Lanier,MD at 2100  Orders Received/Actions taken: MD placed order for 1,062mL NaCl bolus and orders for a lactic acid and BMP blood draw. Patient's RN Joni Reining made aware of this.

## 2019-10-05 NOTE — ED Notes (Signed)
Dr. Delma Officer at bedside.

## 2019-10-05 NOTE — ED Notes (Signed)
Per Dr. Delma Officer pt to be admitted per family demand.

## 2019-10-05 NOTE — ED Notes (Signed)
Attempted to call report

## 2019-10-05 NOTE — Discharge Planning (Signed)
RNCM contacted [facility] to refer pt for home services.

## 2019-10-05 NOTE — ED Notes (Signed)
Ambulated Pt in the RM. Pt started at 93% and stayed between 89%-93% the whole time. Pt took 10 laps around his room.

## 2019-10-05 NOTE — ED Notes (Signed)
Josh PA at bedside   

## 2019-10-05 NOTE — Progress Notes (Signed)
Joe Harris is a 59 y.o. male patient admitted from ED awake, alert - oriented  X 4 - no acute distress noted.  VSS - Blood pressure (!) 149/95, pulse 88, temperature 99.2 F (37.3 C), temperature source Oral, resp. rate 20, height 5\' 11"  (1.803 m), weight 104.3 kg, SpO2 94 %.    IV in place, occlusive dsg intact without redness.  Orientation to room, and floor completed with information packet given to patient/family.  Patient declined safety video at this time.  Admission INP armband ID verified with patient/family, and in place.   SR up x 2, fall assessment complete, with patient and family able to verbalize understanding of risk associated with falls, and verbalized understanding to call nsg before up out of bed.  Call light within reach, patient able to voice, and demonstrate understanding.  Skin, clean-dry- intact without evidence of bruising, or skin tears.   No evidence of skin break down noted on exam.     Will cont to eval and treat per MD orders.  , RN 10/05/2019 3:56 PM

## 2019-10-05 NOTE — ED Notes (Signed)
Dr. Pilar Plate to speak with Dr. Delma Officer

## 2019-10-06 DIAGNOSIS — R197 Diarrhea, unspecified: Secondary | ICD-10-CM | POA: Diagnosis present

## 2019-10-06 DIAGNOSIS — U071 COVID-19: Secondary | ICD-10-CM | POA: Diagnosis present

## 2019-10-06 DIAGNOSIS — Z9119 Patient's noncompliance with other medical treatment and regimen: Secondary | ICD-10-CM | POA: Diagnosis not present

## 2019-10-06 DIAGNOSIS — J1282 Pneumonia due to coronavirus disease 2019: Secondary | ICD-10-CM

## 2019-10-06 DIAGNOSIS — Z8249 Family history of ischemic heart disease and other diseases of the circulatory system: Secondary | ICD-10-CM | POA: Diagnosis not present

## 2019-10-06 DIAGNOSIS — Z8042 Family history of malignant neoplasm of prostate: Secondary | ICD-10-CM | POA: Diagnosis not present

## 2019-10-06 DIAGNOSIS — Z8371 Family history of colonic polyps: Secondary | ICD-10-CM | POA: Diagnosis not present

## 2019-10-06 DIAGNOSIS — G4733 Obstructive sleep apnea (adult) (pediatric): Secondary | ICD-10-CM | POA: Diagnosis present

## 2019-10-06 DIAGNOSIS — Z87891 Personal history of nicotine dependence: Secondary | ICD-10-CM | POA: Diagnosis not present

## 2019-10-06 DIAGNOSIS — Z791 Long term (current) use of non-steroidal anti-inflammatories (NSAID): Secondary | ICD-10-CM | POA: Diagnosis not present

## 2019-10-06 DIAGNOSIS — E861 Hypovolemia: Secondary | ICD-10-CM | POA: Diagnosis present

## 2019-10-06 DIAGNOSIS — I959 Hypotension, unspecified: Secondary | ICD-10-CM | POA: Diagnosis present

## 2019-10-06 DIAGNOSIS — Z7952 Long term (current) use of systemic steroids: Secondary | ICD-10-CM | POA: Diagnosis not present

## 2019-10-06 DIAGNOSIS — Z880 Allergy status to penicillin: Secondary | ICD-10-CM | POA: Diagnosis not present

## 2019-10-06 DIAGNOSIS — Z87820 Personal history of traumatic brain injury: Secondary | ICD-10-CM | POA: Diagnosis not present

## 2019-10-06 DIAGNOSIS — Z79899 Other long term (current) drug therapy: Secondary | ICD-10-CM | POA: Diagnosis not present

## 2019-10-06 DIAGNOSIS — R0602 Shortness of breath: Secondary | ICD-10-CM | POA: Diagnosis present

## 2019-10-06 LAB — D-DIMER, QUANTITATIVE: D-Dimer, Quant: 0.75 ug/mL-FEU — ABNORMAL HIGH (ref 0.00–0.50)

## 2019-10-06 LAB — COMPREHENSIVE METABOLIC PANEL
ALT: 124 U/L — ABNORMAL HIGH (ref 0–44)
AST: 86 U/L — ABNORMAL HIGH (ref 15–41)
Albumin: 2.6 g/dL — ABNORMAL LOW (ref 3.5–5.0)
Alkaline Phosphatase: 54 U/L (ref 38–126)
Anion gap: 14 (ref 5–15)
BUN: 13 mg/dL (ref 6–20)
CO2: 21 mmol/L — ABNORMAL LOW (ref 22–32)
Calcium: 8.2 mg/dL — ABNORMAL LOW (ref 8.9–10.3)
Chloride: 104 mmol/L (ref 98–111)
Creatinine, Ser: 1.16 mg/dL (ref 0.61–1.24)
GFR calc Af Amer: >60 mL/min (ref >60)
GFR calc non Af Amer: >60 mL/min (ref >60)
Glucose, Bld: 118 mg/dL — ABNORMAL HIGH (ref 70–99)
Potassium: 3.9 mmol/L (ref 3.5–5.1)
Sodium: 139 mmol/L (ref 135–145)
Total Bilirubin: 0.7 mg/dL (ref 0.3–1.2)
Total Protein: 5.9 g/dL — ABNORMAL LOW (ref 6.5–8.1)

## 2019-10-06 LAB — CBC WITH DIFFERENTIAL/PLATELET
Abs Immature Granulocytes: 0 10*3/uL (ref 0.00–0.07)
Basophils Absolute: 0 10*3/uL (ref 0.0–0.1)
Basophils Relative: 0 %
Eosinophils Absolute: 0 10*3/uL (ref 0.0–0.5)
Eosinophils Relative: 0 %
HCT: 38.2 % — ABNORMAL LOW (ref 39.0–52.0)
Hemoglobin: 12.6 g/dL — ABNORMAL LOW (ref 13.0–17.0)
Lymphocytes Relative: 13 %
Lymphs Abs: 0.6 10*3/uL — ABNORMAL LOW (ref 0.7–4.0)
MCH: 27.4 pg (ref 26.0–34.0)
MCHC: 33 g/dL (ref 30.0–36.0)
MCV: 83 fL (ref 80.0–100.0)
Monocytes Absolute: 0.4 10*3/uL (ref 0.1–1.0)
Monocytes Relative: 8 %
Neutro Abs: 3.6 10*3/uL (ref 1.7–7.7)
Neutrophils Relative %: 79 %
Platelets: 250 10*3/uL (ref 150–400)
RBC: 4.6 MIL/uL (ref 4.22–5.81)
RDW: 14 % (ref 11.5–15.5)
WBC: 4.6 10*3/uL (ref 4.0–10.5)
nRBC: 0 % (ref 0.0–0.2)
nRBC: 0 /100 WBC

## 2019-10-06 LAB — FERRITIN: Ferritin: 746 ng/mL — ABNORMAL HIGH (ref 24–336)

## 2019-10-06 LAB — LACTIC ACID, PLASMA: Lactic Acid, Venous: 2.4 mmol/L (ref 0.5–1.9)

## 2019-10-06 MED ORDER — FLUTICASONE PROPIONATE 50 MCG/ACT NA SUSP
1.0000 | Freq: Every day | NASAL | Status: DC
  Administered 2019-10-06 – 2019-10-08 (×3): 1 via NASAL
  Filled 2019-10-06: qty 16

## 2019-10-06 MED ORDER — LOPERAMIDE HCL 2 MG PO CAPS
2.0000 mg | ORAL_CAPSULE | ORAL | Status: DC | PRN
  Administered 2019-10-06: 2 mg via ORAL
  Filled 2019-10-06: qty 1

## 2019-10-06 MED ORDER — BACID PO TABS
2.0000 | ORAL_TABLET | Freq: Three times a day (TID) | ORAL | Status: DC
  Administered 2019-10-06 (×2): 2 via ORAL
  Filled 2019-10-06 (×7): qty 2

## 2019-10-06 MED ORDER — SODIUM CHLORIDE 0.9 % IV SOLN
100.0000 mg | Freq: Every day | INTRAVENOUS | Status: AC
  Administered 2019-10-06 – 2019-10-09 (×4): 100 mg via INTRAVENOUS
  Filled 2019-10-06 (×4): qty 20

## 2019-10-06 MED ORDER — LACTATED RINGERS IV BOLUS
1000.0000 mL | Freq: Once | INTRAVENOUS | Status: AC
  Administered 2019-10-06: 1000 mL via INTRAVENOUS

## 2019-10-06 MED ORDER — SODIUM CHLORIDE 0.9 % IV BOLUS: 1000.0000 mL | Freq: Once | INTRAVENOUS | Status: DC

## 2019-10-06 MED ORDER — LACTATED RINGERS IV SOLN: INTRAVENOUS | Status: DC

## 2019-10-06 NOTE — Progress Notes (Addendum)
Subjective: HD1 Overnight, patient noted to have Tmax of 101.9 and elevated lactic acid for which blood cultures obtained and patient given 1L bolus. Also given acetaminophen with improvement.  This morning, patient evaluated at bedside. He notes feeling better this morning and feels like he has better energy overall. He does report having a few loose stools but notes that he took some doses of doxycycline at home. He denies any headaches, dizziness or shortness of breath.   Objective:  Vital signs in last 24 hours: Vitals:   10/06/19 0400 10/06/19 0520 10/06/19 0556 10/06/19 0600  BP: 121/72     Pulse: 73 77 86 82  Resp: (!) 25 (!) 28 (!) 26 (!) 29  Temp: 98 F (36.7 C)     TempSrc: Oral     SpO2: 91% (!) 89% 90% 90%  Weight:      Height:       CBC Latest Ref Rng & Units 10/06/2019 10/04/2019 02/08/2019  WBC 4.0 - 10.5 K/uL 4.6 4.1 -  Hemoglobin 13.0 - 17.0 g/dL 12.6(L) 14.2 16.3  Hematocrit 39.0 - 52.0 % 38.2(L) 44.2 48.0  Platelets 150 - 400 K/uL 250 221 -   CMP Latest Ref Rng & Units 10/06/2019 10/05/2019 10/04/2019  Glucose 70 - 99 mg/dL 756(E) 332(R) 518(A)  BUN 6 - 20 mg/dL 13 13 12   Creatinine 0.61 - 1.24 mg/dL 4.16 6.06  Sodium 135 - 145 mmol/L 139 138 138  Potassium 3.5 - 5.1 mmol/L 3.9 4.2 4.7  Chloride 98 - 111 mmol/L 104 103 102  CO2 22 - 32 mmol/L 21(L) 24 25  Calcium 8.9 - 10.3 mg/dL 8.2(L) 8.7(L) 9.3  Total Protein 6.5 - 8.1 g/dL 5.9(L) - -  Total Bilirubin 0.3 - 1.2 mg/dL 0.7 - -  Alkaline Phos 38 - 126 U/L 54 - -  AST 15 - 41 U/L 86(H) - -  ALT 0 - 44 U/L 124(H) - -   Constitutional: sitting on edge of bed, no acute distress HENT: [place]/AT, EOMI, conjunctivae nl, dry mucus membranes  Cardiovascular: RRR, S1 and S2 present, no m/r/g Respiratory: clear to auscultation bilaterally, no rhonchi or wheezing noted GI/Abdomen: nondistended, soft, normoactive bowel sounds, nontender to palpation  Neurologic: awake, alert and oriented x3, no obvious focal  neurologic deficits   Assessment/Plan:  Active Problems:   Pneumonia due to COVID-19 virus Mr. Estephan Gallardo is a 59yr old male with PMHx of lumbar and cervical disc herniation and OSA (noncompliant with CPAP) admitted for COVID 19 pneumonia.  COVID 19 Pneumonia: Patient admitted for COVID 19 pneumonia in setting of lightheadedness, mild dyspnea, and weakness. He was noted to be febrile to 101.9 with elevated lactic acid overnight for which blood cultures collected, given fluid bolus and tylenol with improvement. This morning, patient reports feeling well. He is saturating around 92-95% on room air. Patient was concerned about drop in hemoglobin level but reassured that there was no emergent need for transfusion at this time. Patient does note loose stools but also notes that he took his wife's doxycycline and steroids to treat himself at home which could be contributing to his symptoms. Inflammatory markers trending down.  - IV Remdesivir day 2/5 - Continue to monitor oxygen requirements and O2 supplementation as needed - Imodium prn - Monitor CRP, Ferritin, D-dimer, Fibrinogen and LDH - Encourage ICS and ambulation - F/u blood cultures   Hypovolemia: Appears volume down on exam.  - LR bolus + maintenance 100 cc/hr - CMP daily  Diet: Regular  Fluids: bolus + LR 100cc/hr Electrolytes: Monitor and replete prn Family communication: Wife updated via telephone and all questions/concerns addressed.   DVT Prophylaxis: Lovenox  Code status: FULL  Prior to Admission Living Arrangement: Home Anticipated Discharge Location: Home Barriers to Discharge: Continued medical management Dispo: Anticipated discharge in approximately 1-2 day(s).   Harvie Heck, MD 10/06/2019, 7:00 AM Pager: [phone]

## 2019-10-06 NOTE — Progress Notes (Signed)
   10/05/19 2352  Assess: MEWS Score  Temp (!) 101.9 F (38.8 C)  BP (!) 127/94  Pulse Rate 88  ECG Heart Rate 88  Resp (!) 26  SpO2 91 %  O2 Device Room Air  Assess: MEWS Score  MEWS Temp 2  MEWS Systolic 0  MEWS Pulse 0  MEWS RR 2  MEWS LOC 0  MEWS Score 4  MEWS Score Color Red  Assess: if the MEWS score is Yellow or Red  Were vital signs taken at a resting state? Yes  Focused Assessment Documented focused assessment  Early Detection of Sepsis Score *See Row Information* Low  MEWS guidelines implemented *See Row Information* Yes  Treat  MEWS Interventions Administered prn meds/treatments;Other (Comment) (MD paged. Tylenol PRN ordered and given.)  Take Vital Signs  Increase Vital Sign Frequency  Red: Q 1hr X 4 then Q 4hr X 4, if remains red, continue Q 4hrs  Escalate  MEWS: Escalate Red: discuss with charge nurse/RN and provider, consider discussing with RRT  Notify: Charge Nurse/RN  Name of Charge Nurse/RN Notified Darcel Bayley, RN  Date Charge Nurse/RN Notified 10/06/19  Time Charge Nurse/RN Notified 0020  Notify: Provider  Provider Name/Title Darl Pikes, MD  Date Provider Notified 10/05/19  Time Provider Notified 2356  Notification Type Page  Notification Reason Other (Comment) (RED MEWS. Temp 101.9.)  Response See new orders  Date of Provider Response 10/05/19  Time of Provider Response 2359  Document  Patient Outcome Stabilized after interventions  Progress note created (see row info) Yes   Patient resting in bed at this time. Patient appears fatigued and tired. No distress noted. Will continue to monitor.

## 2019-10-06 NOTE — Progress Notes (Signed)
This RN has spoke to patient, patient's wife, and patient's daughter regarding diagnosis and plan. Patient had refused Remdesivir earlier d/t being told that the med would not help. This RN explained to patient, patient's wife, and patient's daughter that Remdesivir IV ordered as antiviral to treat Covid pneumonia. This RN explained multiple times that this med is given once a day for five days, telling patient that he does not necessarily have to be in the hospital for five days if his condition improves. Patient eventually agreed to get Remdesivir IV. Med started when available from pharmacy. This RN explained the benefits of IS and flutter valve and instructed patient how to use and how often. This RN also encouraged patient to ambulate, sit up in the chair during the day, and prone or lay on side while sleeping. Patient repeatedly stating that he wants to get rid of the pneumonia. Patient pleasant and cooperative with assessment. Patient's daughter, Okey Regal [phone], states that she can be called with any needs and/or if patient is uncooperative since she may be able to persuade him. Will continue to monitor.

## 2019-10-06 NOTE — Progress Notes (Signed)
CRITICAL VALUE ALERT  Critical Value:  Lactic Acid 2.4. NS bolus ended approximately 2330.  Date & Time Notied:  This RN not notified by lab but paged MD 10/06/19 0249  Provider Notified: Darl Pikes, MD  Orders Received/Actions taken: No new orders.

## 2019-10-07 LAB — CBC WITH DIFFERENTIAL/PLATELET
Abs Immature Granulocytes: 0.07 10*3/uL (ref 0.00–0.07)
Basophils Absolute: 0 10*3/uL (ref 0.0–0.1)
Basophils Relative: 0 %
Eosinophils Absolute: 0 10*3/uL (ref 0.0–0.5)
Eosinophils Relative: 0 %
HCT: 38.3 % — ABNORMAL LOW (ref 39.0–52.0)
Hemoglobin: 12.4 g/dL — ABNORMAL LOW (ref 13.0–17.0)
Immature Granulocytes: 1 %
Lymphocytes Relative: 21 %
Lymphs Abs: 1.1 10*3/uL (ref 0.7–4.0)
MCH: 26.6 pg (ref 26.0–34.0)
MCHC: 32.4 g/dL (ref 30.0–36.0)
MCV: 82 fL (ref 80.0–100.0)
Monocytes Absolute: 0.6 10*3/uL (ref 0.1–1.0)
Monocytes Relative: 11 %
Neutro Abs: 3.6 10*3/uL (ref 1.7–7.7)
Neutrophils Relative %: 67 %
Platelets: ADEQUATE 10*3/uL (ref 150–400)
RBC: 4.67 MIL/uL (ref 4.22–5.81)
RDW: 13.9 % (ref 11.5–15.5)
WBC: 5.3 10*3/uL (ref 4.0–10.5)
nRBC: 0 % (ref 0.0–0.2)

## 2019-10-07 LAB — COMPREHENSIVE METABOLIC PANEL
ALT: 93 U/L — ABNORMAL HIGH (ref 0–44)
AST: 52 U/L — ABNORMAL HIGH (ref 15–41)
Albumin: 2.7 g/dL — ABNORMAL LOW (ref 3.5–5.0)
Alkaline Phosphatase: 56 U/L (ref 38–126)
Anion gap: 9 (ref 5–15)
BUN: 13 mg/dL (ref 6–20)
CO2: 24 mmol/L (ref 22–32)
Calcium: 8.6 mg/dL — ABNORMAL LOW (ref 8.9–10.3)
Chloride: 105 mmol/L (ref 98–111)
Creatinine, Ser: 0.92 mg/dL (ref 0.61–1.24)
GFR calc Af Amer: >60 mL/min (ref >60)
GFR calc non Af Amer: >60 mL/min (ref >60)
Glucose, Bld: 103 mg/dL — ABNORMAL HIGH (ref 70–99)
Potassium: 4 mmol/L (ref 3.5–5.1)
Sodium: 138 mmol/L (ref 135–145)
Total Bilirubin: 0.9 mg/dL (ref 0.3–1.2)
Total Protein: 5.9 g/dL — ABNORMAL LOW (ref 6.5–8.1)

## 2019-10-07 LAB — FERRITIN: Ferritin: 594 ng/mL — ABNORMAL HIGH (ref 24–336)

## 2019-10-07 LAB — FIBRINOGEN: Fibrinogen: 578 mg/dL — ABNORMAL HIGH (ref 210–475)

## 2019-10-07 LAB — C-REACTIVE PROTEIN: CRP: 5 mg/dL — ABNORMAL HIGH (ref <1.0)

## 2019-10-07 LAB — D-DIMER, QUANTITATIVE: D-Dimer, Quant: 0.82 ug/mL-FEU — ABNORMAL HIGH (ref 0.00–0.50)

## 2019-10-07 NOTE — Progress Notes (Signed)
Subjective: HD 2 Overnight, no acute events reported  Patient evaluated at bedside today. He notes feeling much improved. No further bowel movements but still does have some "gas". Discussed that he is stable for discharge to home and continue treatment as outpatient; however, patient would like to continue with hospitalization for remdesivir therapy at this time.   Objective:  Vital signs in last 24 hours: Vitals:   10/06/19 1700 10/06/19 2031 10/07/19 0412 10/07/19 0414  BP:  (!) 161/93 (!) 143/90   Pulse: 87 95 82   Resp:  20 18   Temp:    98 F (36.7 C)  TempSrc:    Oral  SpO2: 94% 94% 92%   Weight:      Height:       CBC Latest Ref Rng & Units 10/06/2019 10/04/2019 02/08/2019  WBC 4.0 - 10.5 K/uL 4.6 4.1 -  Hemoglobin 13.0 - 17.0 g/dL 12.6(L) 14.2 16.3  Hematocrit 39.0 - 52.0 % 38.2(L) 44.2 48.0  Platelets 150 - 400 K/uL 250 221 -   CMP Latest Ref Rng & Units 10/07/2019 10/06/2019 10/05/2019  Glucose 70 - 99 mg/dL 062(I) 948(N) 462(V)  BUN 6 - 20 mg/dL 13 13 13   Creatinine 0.61 - 1.24 mg/dL 0.35 0.09  Sodium 135 - 145 mmol/L 138 139 138  Potassium 3.5 - 5.1 mmol/L 4.0 3.9 4.2  Chloride 98 - 111 mmol/L 105 104 103  CO2 22 - 32 mmol/L 24 21(L) 24  Calcium 8.9 - 10.3 mg/dL 3.81) 8.2(X) 9.3(Z)  Total Protein 6.5 - 8.1 g/dL 5.9(L) 5.9(L) -  Total Bilirubin 0.3 - 1.2 mg/dL 0.9 0.7 -  Alkaline Phos 38 - 126 U/L 56 54 -  AST 15 - 41 U/L 52(H) 86(H) -  ALT 0 - 44 U/L 93(H) 124(H) -   Constitutional: sitting on edge of bed, no acute distress HENT: [place]/AT, EOMI, conjunctivae nl, dry mucus membranes  Cardiovascular: RRR, S1 and S2 present, no m/r/g Respiratory: clear to auscultation bilaterally, no rhonchi or wheezing noted GI/Abdomen: nondistended, soft, normoactive bowel sounds, mild epigastric tenderness to palpation  Neurologic: awake, alert and oriented x3, no obvious focal neurologic deficits   Assessment/Plan:  Principal Problem:   Pneumonia due to COVID-19  virus Active Problems:   Diarrhea [name] is a 59yr old male with PMHx of lumbar and cervical disc herniation and OSA (noncompliant with CPAP) admitted for COVID 19 pneumonia.  COVID 19 Pneumonia: Patient admitted for COVID 19 pneumonia in setting of lightheadedness, mild dyspnea, and weakness. Patient notes feeling improved today. He has been afebrile over past 24 hours. Inflammatory markers also improved overall, although D-dimer slightly up 0.75>0.82 and CRP 4.8>5.0. Patient is saturating well on room air and has not had any further bowel movements. Discussed that he is stable for outpatient treatment; however, he expresses concerns regarding safety with outpatient therapy and would prefer to remain in hospital for completion of remdesivir.  - IV Remdesivir day 3/5 - Continue to monitor oxygen requirements and O2 supplementation as needed - Imodium prn - Monitor CRP, Ferritin, D-dimer, Fibrinogen and LDH - Encourage ICS and ambulation  Diet: Regular  Fluids: LR 100cc/hr Electrolytes: Monitor and replete prn Family communication: Wife updated via telephone and all questions/concerns addressed.   DVT Prophylaxis: Lovenox  Code status: FULL  Prior to Admission Living Arrangement: Home Anticipated Discharge Location: Home Barriers to Discharge: Continued medical management Dispo: Anticipated discharge in approximately 1-2 day(s).   40yr, MD 10/07/2019, 7:30 AM Pager:  [phone]  

## 2019-10-08 LAB — COMPREHENSIVE METABOLIC PANEL
ALT: 73 U/L — ABNORMAL HIGH (ref 0–44)
AST: 42 U/L — ABNORMAL HIGH (ref 15–41)
Albumin: 2.6 g/dL — ABNORMAL LOW (ref 3.5–5.0)
Alkaline Phosphatase: 60 U/L (ref 38–126)
Anion gap: 7 (ref 5–15)
BUN: 12 mg/dL (ref 6–20)
CO2: 26 mmol/L (ref 22–32)
Calcium: 8.6 mg/dL — ABNORMAL LOW (ref 8.9–10.3)
Chloride: 104 mmol/L (ref 98–111)
Creatinine, Ser: 0.91 mg/dL (ref 0.61–1.24)
GFR calc Af Amer: >60 mL/min (ref >60)
GFR calc non Af Amer: >60 mL/min (ref >60)
Glucose, Bld: 101 mg/dL — ABNORMAL HIGH (ref 70–99)
Potassium: 4 mmol/L (ref 3.5–5.1)
Sodium: 137 mmol/L (ref 135–145)
Total Bilirubin: 1.1 mg/dL (ref 0.3–1.2)
Total Protein: 6.2 g/dL — ABNORMAL LOW (ref 6.5–8.1)

## 2019-10-08 LAB — CBC WITH DIFFERENTIAL/PLATELET
Abs Immature Granulocytes: 0.08 10*3/uL — ABNORMAL HIGH (ref 0.00–0.07)
Basophils Absolute: 0 10*3/uL (ref 0.0–0.1)
Basophils Relative: 0 %
Eosinophils Absolute: 0.1 10*3/uL (ref 0.0–0.5)
Eosinophils Relative: 1 %
HCT: 38.1 % — ABNORMAL LOW (ref 39.0–52.0)
Hemoglobin: 12.5 g/dL — ABNORMAL LOW (ref 13.0–17.0)
Immature Granulocytes: 1 %
Lymphocytes Relative: 20 %
Lymphs Abs: 1.3 10*3/uL (ref 0.7–4.0)
MCH: 26.8 pg (ref 26.0–34.0)
MCHC: 32.8 g/dL (ref 30.0–36.0)
MCV: 81.8 fL (ref 80.0–100.0)
Monocytes Absolute: 0.8 10*3/uL (ref 0.1–1.0)
Monocytes Relative: 12 %
Neutro Abs: 4.3 10*3/uL (ref 1.7–7.7)
Neutrophils Relative %: 66 %
Platelets: 398 10*3/uL (ref 150–400)
RBC: 4.66 MIL/uL (ref 4.22–5.81)
RDW: 13.6 % (ref 11.5–15.5)
WBC: 6.5 10*3/uL (ref 4.0–10.5)
nRBC: 0 % (ref 0.0–0.2)

## 2019-10-08 LAB — C-REACTIVE PROTEIN: CRP: 7.2 mg/dL — ABNORMAL HIGH (ref <1.0)

## 2019-10-08 LAB — D-DIMER, QUANTITATIVE: D-Dimer, Quant: 0.85 ug/mL-FEU — ABNORMAL HIGH (ref 0.00–0.50)

## 2019-10-08 LAB — LACTATE DEHYDROGENASE: LDH: 305 U/L — ABNORMAL HIGH (ref 98–192)

## 2019-10-08 LAB — FERRITIN: Ferritin: 578 ng/mL — ABNORMAL HIGH (ref 24–336)

## 2019-10-08 NOTE — Progress Notes (Signed)
   Subjective: HD3   No acute events overnight. Patient feeling tired this morning, but otherwise doing ok. No chest pain of shortness of breath. Tolerating PO.   Objective:  Vital signs in last 24 hours: Vitals:   10/07/19 1535 10/07/19 2015 10/07/19 2342 10/08/19 0405  BP:  (!) 146/96 133/76 120/68  Pulse: 88 94 87 83  Resp:  20 20 20   Temp: 98.1 F (36.7 C) 99.8 F (37.7 C) 98.7 F (37.1 C) 98.4 F (36.9 C)  TempSrc: Oral Oral Oral Oral  SpO2: 92% 92% 92% 91%  Weight:      Height:       General: awake, alert, lying in bed in NAD CV: RRR Pulm: normal work of breathing; lungs CTAB   Assessment/Plan:  Principal Problem:   Pneumonia due to COVID-19 virus Active Problems:   Diarrhea  Mr. Hutchinson Isenberg is a 59yr old male with PMHx of lumbar and cervical disc herniation and OSA (noncompliant with CPAP) admitted for COVID 19 pneumonia.  COVID 19 Pneumonia: Patient admitted for COVID 19 pneumonia in setting of lightheadedness, mild dyspnea, and weakness. He was initiated on remdesivir. Has not required steroids due to no increased oxygen requirements. Inflammatory markers stable. Patient continues to saturate well on room air and has not had any further bowel movements. Discussed that he is stable for outpatient treatment; however, he expresses concerns regarding safety with outpatient therapy and would prefer to remain in hospital for completion of remdesivir.  - IV Remdesivir day 4/5 - Continue to monitor oxygen requirements and O2 supplementation as needed - Imodium prn - Monitor CRP, Ferritin, D-dimer, Fibrinogen and LDH - Encourage ICS and ambulation  Prior to Admission Living Arrangement: Home Anticipated Discharge Location: Home Barriers to Discharge: Continued medical management  Dispo: Anticipated discharge in approximately 1 day(s).   40yr D, DO 10/08/2019, 7:19 AM Pager: [phone]

## 2019-10-08 NOTE — Progress Notes (Signed)
Pt called out multiple times requesting for something to moisturize his mouth, offered pt water, mouth rinse / moisturizer, ice, popsicles, etc. Pt refused everything that was offered but wanted IVF increased. Educated pt on need for IVF and also informed pt that he was not dehydrated like he previously stated because he was getting IVF an had great PO intake. Pt still declined to take what I had offered him, no other needs at this time, will CTM.

## 2019-10-09 LAB — CBC WITH DIFFERENTIAL/PLATELET
Abs Immature Granulocytes: 0.13 10*3/uL — ABNORMAL HIGH (ref 0.00–0.07)
Basophils Absolute: 0 10*3/uL (ref 0.0–0.1)
Basophils Relative: 0 %
Eosinophils Absolute: 0.2 10*3/uL (ref 0.0–0.5)
Eosinophils Relative: 2 %
HCT: 40.2 % (ref 39.0–52.0)
Hemoglobin: 13.3 g/dL (ref 13.0–17.0)
Immature Granulocytes: 2 %
Lymphocytes Relative: 22 %
Lymphs Abs: 1.8 10*3/uL (ref 0.7–4.0)
MCH: 27.1 pg (ref 26.0–34.0)
MCHC: 33.1 g/dL (ref 30.0–36.0)
MCV: 82 fL (ref 80.0–100.0)
Monocytes Absolute: 0.9 10*3/uL (ref 0.1–1.0)
Monocytes Relative: 12 %
Neutro Abs: 5.1 10*3/uL (ref 1.7–7.7)
Neutrophils Relative %: 62 %
Platelets: 585 10*3/uL — ABNORMAL HIGH (ref 150–400)
RBC: 4.9 MIL/uL (ref 4.22–5.81)
RDW: 13.4 % (ref 11.5–15.5)
WBC: 8.1 10*3/uL (ref 4.0–10.5)
nRBC: 0 % (ref 0.0–0.2)

## 2019-10-09 LAB — LACTATE DEHYDROGENASE: LDH: 304 U/L — ABNORMAL HIGH (ref 98–192)

## 2019-10-09 LAB — C-REACTIVE PROTEIN: CRP: 9 mg/dL — ABNORMAL HIGH (ref <1.0)

## 2019-10-09 LAB — COMPREHENSIVE METABOLIC PANEL
ALT: 69 U/L — ABNORMAL HIGH (ref 0–44)
AST: 41 U/L (ref 15–41)
Albumin: 2.7 g/dL — ABNORMAL LOW (ref 3.5–5.0)
Alkaline Phosphatase: 66 U/L (ref 38–126)
Anion gap: 9 (ref 5–15)
BUN: 11 mg/dL (ref 6–20)
CO2: 26 mmol/L (ref 22–32)
Calcium: 8.8 mg/dL — ABNORMAL LOW (ref 8.9–10.3)
Chloride: 103 mmol/L (ref 98–111)
Creatinine, Ser: 1.09 mg/dL (ref 0.61–1.24)
GFR calc Af Amer: >60 mL/min (ref >60)
GFR calc non Af Amer: >60 mL/min (ref >60)
Glucose, Bld: 97 mg/dL (ref 70–99)
Potassium: 4 mmol/L (ref 3.5–5.1)
Sodium: 138 mmol/L (ref 135–145)
Total Bilirubin: 1.1 mg/dL (ref 0.3–1.2)
Total Protein: 6.9 g/dL (ref 6.5–8.1)

## 2019-10-09 LAB — D-DIMER, QUANTITATIVE: D-Dimer, Quant: 0.81 ug/mL-FEU — ABNORMAL HIGH (ref 0.00–0.50)

## 2019-10-09 LAB — FERRITIN: Ferritin: 614 ng/mL — ABNORMAL HIGH (ref 24–336)

## 2019-10-09 NOTE — Progress Notes (Signed)
  Date: 10/09/2019  Patient name: Joe Harris  Medical record number: [id number]  Date of birth: August 19, 1960        I have seen and evaluated this patient and I have discussed the plan of care with the house staff. Please see their note for complete details. I concur with their findings with the following additions/corrections: Resp stable. O2 sats mid 90's% on RA. D/C home  Burns Spain, MD 10/09/2019, 1:15 PM

## 2019-10-09 NOTE — Discharge Summary (Signed)
Name: Joe Harris MRN: [id number] DOB: 04/20/1961 59 y.o. PCP: Charlott Rakes, MD  Date of Admission: 10/04/2019 10:25 PM Date of Discharge: 10/09/19 Attending Physician: Burns Spain, MD  Discharge Diagnosis: 1. Pneumonia due to COVID-19   Discharge Medications: Allergies as of 10/09/2019      Reactions   Penicillins Rash      Medication List    STOP taking these medications   doxycycline 100 MG capsule Commonly known as: MONODOX   meclizine 25 MG tablet Commonly known as: ANTIVERT   predniSONE 20 MG tablet Commonly known as: DELTASONE     TAKE these medications   acetaminophen 500 MG tablet Commonly known as: TYLENOL Take 1,000 mg by mouth every 6 (six) hours as needed for mild pain.   ascorbic acid 500 MG tablet Commonly known as: VITAMIN C Take 500 mg by mouth daily.   ibuprofen 200 MG tablet Commonly known as: ADVIL Take 400 mg by mouth every 6 (six) hours as needed for fever or headache.   MEGA MULTI MEN PO Take 1 tablet by mouth daily. One packet as needed   zinc gluconate 50 MG tablet Take 50 mg by mouth daily.       Disposition and follow-up:   Mr.Joe Harris was discharged from [facility] in Stable condition.  At the hospital follow up visit please address:  1.  COVID-19: Evaluate for respiratory symptoms. Completed course of Remdisivir. He did not require steroids given normal saturation on room air.   2.  Labs / imaging needed at time of follow-up: CXR in 6 weeks   3.  Pending labs/ test needing follow-up: none   Follow-up Appointments:   Hospital Course by problem list: 1. COVID-19 pneumonia: Patientadmitted for COVID 19 pneumonia in setting of lightheadedness, mild dyspnea, and weakness.He was initiated on remdesivir. He did not require steroids due to no increased oxygen requirements. Inflammatory markers improved overall throughout hospital course. Patient maintained O2 sats while ambulating during  hospitalization and therefore did not meet requirements for home O2. He had improved symptomatically by HD5 and was discharged home in stable condition after completion of Remdesivir infusion. He was encouraged to continue recovering at home by staying hydrated and slowly increasing his activity. He was scheduled for close follow-up in Post COVID care clinic.   Discharge Vitals:   BP (!) 135/94   Pulse 86   Temp 97.8 F (36.6 C)   Resp 20   Ht 5\' 11"  (1.803 m)   Wt 104.3 kg   SpO2 95%   BMI 32.08 kg/m   Pertinent Labs, Studies, and Procedures:  CXR 4/28: IMPRESSION: Multifocal hazy, linear opacities within both lungs may indicate multifocal infection.  Discharge Instructions: Discharge Instructions    Call MD for:  difficulty breathing, headache or visual disturbances   Complete by: As directed    Call MD for:  extreme fatigue   Complete by: As directed    Call MD for:  persistant dizziness or light-headedness   Complete by: As directed    Call MD for:  persistant nausea and vomiting   Complete by: As directed    Call MD for:  severe uncontrolled pain   Complete by: As directed    Call MD for:  temperature >100.4   Complete by: As directed    Diet - low sodium heart healthy   Complete by: As directed    Discharge instructions   Complete by: As directed    [name], thank  you for allowing [place] to provide your care during your hospital stay. You were treated for COVID-19. Please continue to quarantine at home for a total of 14 days from your positive test (so 10/19/19). Please schedule a follow-up appointment with your primary care doctor after completion of quarantine.  Hope you continue to recover well at home.   Take care!   Increase activity slowly   Complete by: As directed    MyChart COVID-19 home monitoring program   Complete by: Oct 09, 2019    Is the patient willing to use the [facility] for home monitoring?: Yes      Signed: Delice Bison,  DO 10/09/2019, 11:25 AM   Pager: [phone]

## 2019-10-09 NOTE — Progress Notes (Signed)
Joe Harris to be D/C'd Home per MD order.  Discussed with the patient and all questions fully answered.  VSS, Skin clean, dry and intact without evidence of skin break down, no evidence of skin tears noted. IV catheter discontinued intact. Site without signs and symptoms of complications. Dressing and pressure applied.  An After Visit Summary was printed and given to the patient. Patient received prescription + Eliquis trial.  D/c education completed with patient including follow up instructions, medication list, d/c activities limitations if indicated, with other d/c instructions as indicated by MD - patient able to verbalize understanding, all questions fully answered.   Patient instructed to return to ED, call 911, or call MD for any changes in condition.   Patient escorted via WC, and D/C home via private auto.  Quincy Carnes 10/09/2019 11:32 AM

## 2019-10-09 NOTE — Progress Notes (Signed)
   Subjective: No acute events overnight. [name] is feeling better this morning and is anxious to get home since he has completed the 5 day course of remdesivir. No chest pain, shortness of breath or light headedness.   Objective:  Vital signs in last 24 hours: Vitals:   10/08/19 2009 10/09/19 0036 10/09/19 0315 10/09/19 0410  BP: (!) 137/91   (!) 135/94  Pulse: 86     Resp: 20  20   Temp: 98.3 F (36.8 C) 98.7 F (37.1 C) 98.4 F (36.9 C) 97.8 F (36.6 C)  TempSrc: Oral  Oral   SpO2: 95%     Weight:      Height:       General: awake, alert, sitting up in bed in NAD CV: RRR Pulm: normal work of breathing on room air; lungs CTAB  Assessment/Plan:  Principal Problem:   Pneumonia due to COVID-19 virus Active Problems:   Diarrhea  [name] is a 59yr old male with PMHx of lumbar and cervical disc herniation and OSA (noncompliant with CPAP) admitted for COVID 19 pneumonia.  COVID 19 Pneumonia: Patientadmitted for COVID 19 pneumonia in setting of lightheadedness, mild dyspnea, and weakness.He was initiated on remdesivir. Has did not require steroids given that he maintained normal oxygen saturation on room air. Inflammatory markers stable. He has now completed the 5 day course of Remdesivir and will be discharged home today. He was advised of continuing to quarantine at home for total of 14 days.   Prior to Admission Living Arrangement: home Anticipated Discharge Location: home Barriers to Discharge: none Dispo: Patient will be discharged home today.  Joe Harris D, DO 10/09/2019, 5:55 AM Pager: [phone]

## 2019-10-10 LAB — CULTURE, BLOOD (ROUTINE X 2)
Culture: NO GROWTH
Culture: NO GROWTH
Special Requests: ADEQUATE
Special Requests: ADEQUATE

## 2019-10-11 ENCOUNTER — Ambulatory Visit (INDEPENDENT_AMBULATORY_CARE_PROVIDER_SITE_OTHER): Payer: Medicare Other | Admitting: Nurse Practitioner

## 2019-10-11 ENCOUNTER — Other Ambulatory Visit: Payer: Self-pay

## 2019-10-11 VITALS — BP 142/74 | HR 95 | Temp 97.5°F | Ht 71.0 in | Wt 222.5 lb

## 2019-10-11 DIAGNOSIS — U071 COVID-19: Secondary | ICD-10-CM

## 2019-10-11 DIAGNOSIS — R5381 Other malaise: Secondary | ICD-10-CM | POA: Insufficient documentation

## 2019-10-11 DIAGNOSIS — R0602 Shortness of breath: Secondary | ICD-10-CM | POA: Diagnosis not present

## 2019-10-11 DIAGNOSIS — J1282 Pneumonia due to coronavirus disease 2019: Secondary | ICD-10-CM | POA: Diagnosis not present

## 2019-10-11 MED ORDER — PREDNISONE 10 MG PO TABS
ORAL_TABLET | ORAL | 0 refills | Status: AC
Start: 2019-10-11 — End: ?

## 2019-10-11 MED ORDER — ALBUTEROL SULFATE HFA 108 (90 BASE) MCG/ACT IN AERS
2.0000 | INHALATION_SPRAY | Freq: Four times a day (QID) | RESPIRATORY_TRACT | 0 refills | Status: AC | PRN
Start: 2019-10-11 — End: ?
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
## 2019-10-11 NOTE — Assessment & Plan Note (Signed)
Covid Pneumonia Shortness of breath Cough Physical deconditioning:  Glad you are improving!  Patient walked in office today sats dropped to 85% while ambulating - returned to 84% when placed on 2 L [place].   Will order O2 for patient - may use 2 L O2 while ambulating and RA at rest  Please keep close check on O2 sats - If sats continue to drop please go to the ED  Will order prednisone  Will order albuterol  Will order home health nursing and PT  Deep breathing exercises  Stay active - will order PT  Will check follow up chest xray in 2 weeks  Will need follow up labs in 2 weeks   Follow up:  1 week

## 2019-10-11 NOTE — Patient Instructions (Addendum)
Covid Pneumonia Shortness of breath:  Glad you are improving!  Patient walked in office today sats dropped to 85% while ambulating - returned to 84% when placed on 2 L [place].   Will order O2 for patient - may use 2 L O2 while ambulating and RA at rest  Please keep close check on O2 sats - If sats continue to drop please go to the ED  Will order prednisone  Will order albuterol  Will order home health nursing and PT  Deep breathing exercises  Stay active  Will check follow up chest xray in 2 weeks  Will need follow up labs in 2 weeks   Follow up:  2 weeks

## 2019-10-12 DIAGNOSIS — J1282 Pneumonia due to coronavirus disease 2019: Secondary | ICD-10-CM | POA: Diagnosis not present

## 2019-10-12 DIAGNOSIS — R0602 Shortness of breath: Secondary | ICD-10-CM | POA: Diagnosis not present

## 2019-10-19 ENCOUNTER — Ambulatory Visit (INDEPENDENT_AMBULATORY_CARE_PROVIDER_SITE_OTHER): Payer: Medicare Other | Admitting: Nurse Practitioner

## 2019-10-19 ENCOUNTER — Other Ambulatory Visit: Payer: Self-pay

## 2019-10-19 ENCOUNTER — Ambulatory Visit
Admission: RE | Admit: 2019-10-19 | Discharge: 2019-10-19 | Disposition: A | Discharge status: Home / Self Care | Payer: Medicare Other | Source: Ambulatory Visit | Attending: Nurse Practitioner | Admitting: Nurse Practitioner

## 2019-10-19 VITALS — BP 122/88 | HR 84 | Temp 97.7°F | Ht 71.0 in | Wt 222.0 lb

## 2019-10-19 DIAGNOSIS — J1282 Pneumonia due to coronavirus disease 2019: Secondary | ICD-10-CM

## 2019-10-19 DIAGNOSIS — U071 COVID-19: Secondary | ICD-10-CM | POA: Diagnosis not present

## 2019-10-19 DIAGNOSIS — Z8616 Personal history of COVID-19: Secondary | ICD-10-CM
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
## 2019-10-19 NOTE — Patient Instructions (Addendum)
Covid Pneumonia Shortness of breath Cough Physical deconditioning:  Glad you are improving!  Patient walked in office today sats remained above 90% for entire walk  Please keep close check on O2 sats at home  Continue albuterol as needed  Continue deep breathing exercises  Stay active   Will check follow up chest xray   Will need follow up labs - CBC and CMP   Follow up:  As needed 

## 2019-10-19 NOTE — Assessment & Plan Note (Signed)
Covid Pneumonia Shortness of breath Cough Physical deconditioning:  Glad you are improving!  Patient walked in office today sats remained above 90% for entire walk  Please keep close check on O2 sats at home  Continue albuterol as needed  Continue deep breathing exercises  Stay active   Will check follow up chest xray   Will need follow up labs - CBC and CMP   Follow up:  As needed

## 2019-10-22 LAB — CBC WITH DIFFERENTIAL/PLATELET
Basophils Absolute: 0 10*3/uL (ref 0.0–0.2)
Basos: 1 %
EOS (ABSOLUTE): 0.3 10*3/uL (ref 0.0–0.4)
Eos: 4 %
Hematocrit: 39.5 % (ref 37.5–51.0)
Hemoglobin: 12.8 g/dL — ABNORMAL LOW (ref 13.0–17.7)
Immature Grans (Abs): 0 10*3/uL (ref 0.0–0.1)
Immature Granulocytes: 0 %
Lymphocytes Absolute: 2 10*3/uL (ref 0.7–3.1)
Lymphs: 27 %
MCH: 27.2 pg (ref 26.6–33.0)
MCHC: 32.4 g/dL (ref 31.5–35.7)
MCV: 84 fL (ref 79–97)
Monocytes Absolute: 0.6 10*3/uL (ref 0.1–0.9)
Monocytes: 9 %
Neutrophils Absolute: 4.4 10*3/uL (ref 1.4–7.0)
Neutrophils: 59 %
Platelets: 603 10*3/uL — ABNORMAL HIGH (ref 150–450)
RBC: 4.7 x10E6/uL (ref 4.14–5.80)
RDW: 13.8 % (ref 11.6–15.4)
WBC: 7.3 10*3/uL (ref 3.4–10.8)

## 2019-10-22 LAB — COMPREHENSIVE METABOLIC PANEL
ALT: 42 IU/L (ref 0–44)
AST: 28 IU/L (ref 0–40)
Albumin/Globulin Ratio: 1.2 (ref 1.2–2.2)
Albumin: 3.8 g/dL (ref 3.8–4.9)
Alkaline Phosphatase: 71 IU/L (ref 39–117)
BUN/Creatinine Ratio: 12 (ref 9–20)
BUN: 13 mg/dL (ref 6–24)
Bilirubin Total: 0.3 mg/dL (ref 0.0–1.2)
CO2: 21 mmol/L (ref 20–29)
Calcium: 9.9 mg/dL (ref 8.7–10.2)
Chloride: 103 mmol/L (ref 96–106)
Creatinine, Ser: 1.06 mg/dL (ref 0.76–1.27)
GFR calc Af Amer: 89 mL/min/{1.73_m2} (ref >59)
GFR calc non Af Amer: 77 mL/min/{1.73_m2} (ref >59)
Globulin, Total: 3.1 g/dL (ref 1.5–4.5)
Glucose: 98 mg/dL (ref 65–99)
Potassium: 4.9 mmol/L (ref 3.5–5.2)
Sodium: 142 mmol/L (ref 134–144)
Total Protein: 6.9 g/dL (ref 6.0–8.5)

## 2019-10-28 NOTE — Progress Notes (Signed)
Left voicemail for callback.

## 2019-10-31 NOTE — Progress Notes (Signed)
Patient returned phone call, appointment scheduled for 6/14 at 11am.

## 2019-11-12 DIAGNOSIS — R0602 Shortness of breath: Secondary | ICD-10-CM | POA: Diagnosis not present

## 2019-11-12 DIAGNOSIS — J1282 Pneumonia due to coronavirus disease 2019: Secondary | ICD-10-CM | POA: Diagnosis not present

## 2019-11-21 ENCOUNTER — Other Ambulatory Visit: Payer: Self-pay

## 2019-11-21 ENCOUNTER — Ambulatory Visit
Admission: RE | Admit: 2019-11-21 | Discharge: 2019-11-21 | Disposition: A | Discharge status: Home / Self Care | Payer: Medicare Other | Source: Ambulatory Visit | Attending: Nurse Practitioner | Admitting: Nurse Practitioner

## 2019-11-21 ENCOUNTER — Ambulatory Visit (INDEPENDENT_AMBULATORY_CARE_PROVIDER_SITE_OTHER): Payer: Medicare Other | Admitting: Nurse Practitioner

## 2019-11-21 VITALS — HR 63 | Temp 97.7°F

## 2019-11-21 DIAGNOSIS — J1282 Pneumonia due to coronavirus disease 2019: Secondary | ICD-10-CM

## 2019-11-21 DIAGNOSIS — Z8616 Personal history of COVID-19: Secondary | ICD-10-CM | POA: Diagnosis not present

## 2019-11-21 DIAGNOSIS — U071 COVID-19: Secondary | ICD-10-CM

## 2019-11-21 DIAGNOSIS — J189 Pneumonia, unspecified organism: Secondary | ICD-10-CM | POA: Diagnosis not present

## 2019-11-21 DIAGNOSIS — J984 Other disorders of lung: Secondary | ICD-10-CM | POA: Diagnosis not present

## 2019-11-21 NOTE — Assessment & Plan Note (Signed)
Covid Pneumonia Shortness of breath Cough Physical deconditioning:  Glad you are better!   Continue albuterol as needed  Continue deep breathing exercises  Stay active  Will check follow up chest xray   Will need follow up labs - CBC and CMP   Follow up:  Follow up with PCP in 1-2 months  Follow up with post covid care as needed
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
## 2019-11-21 NOTE — Patient Instructions (Addendum)
History of COVID-19 Covid Pneumonia Shortness of breath Cough Physical deconditioning:  Glad you are better!   Continue albuterol as needed  Continue deep breathing exercises  Stay active  Will check follow up chest xray   Will need follow up labs - CBC and CMP   Follow up:  Follow up with PCP in 1-2 months  Follow up with post covid care as needed

## 2019-11-23 ENCOUNTER — Other Ambulatory Visit: Payer: Self-pay | Admitting: Nurse Practitioner

## 2019-11-23 DIAGNOSIS — U071 COVID-19: Secondary | ICD-10-CM

## 2019-11-23 DIAGNOSIS — J1282 Pneumonia due to coronavirus disease 2019: Secondary | ICD-10-CM

## 2019-11-24 LAB — COMPREHENSIVE METABOLIC PANEL
ALT: 31 IU/L (ref 0–44)
AST: 29 IU/L (ref 0–40)
Albumin/Globulin Ratio: 1.5 (ref 1.2–2.2)
Albumin: 4.5 g/dL (ref 3.8–4.9)
Alkaline Phosphatase: 70 IU/L (ref 48–121)
BUN/Creatinine Ratio: 10 (ref 9–20)
BUN: 10 mg/dL (ref 6–24)
Bilirubin Total: 0.4 mg/dL (ref 0.0–1.2)
CO2: 20 mmol/L (ref 20–29)
Calcium: 10.1 mg/dL (ref 8.7–10.2)
Chloride: 103 mmol/L (ref 96–106)
Creatinine, Ser: 1.01 mg/dL (ref 0.76–1.27)
GFR calc Af Amer: 94 mL/min/{1.73_m2} (ref >59)
GFR calc non Af Amer: 82 mL/min/{1.73_m2} (ref >59)
Globulin, Total: 3 g/dL (ref 1.5–4.5)
Glucose: 96 mg/dL (ref 65–99)
Potassium: 4.9 mmol/L (ref 3.5–5.2)
Sodium: 141 mmol/L (ref 134–144)
Total Protein: 7.5 g/dL (ref 6.0–8.5)

## 2019-11-24 LAB — CBC
Hematocrit: 44.3 % (ref 37.5–51.0)
Hemoglobin: 14.2 g/dL (ref 13.0–17.7)
MCH: 27.5 pg (ref 26.6–33.0)
MCHC: 32.1 g/dL (ref 31.5–35.7)
MCV: 86 fL (ref 79–97)
Platelets: 308 10*3/uL (ref 150–450)
RBC: 5.17 x10E6/uL (ref 4.14–5.80)
RDW: 15.6 % — ABNORMAL HIGH (ref 11.6–15.4)
WBC: 6.7 10*3/uL (ref 3.4–10.8)

## 2019-12-13 DIAGNOSIS — Z1152 Encounter for screening for COVID-19: Secondary | ICD-10-CM | POA: Diagnosis not present

## 2019-12-13 DIAGNOSIS — Z20822 Contact with and (suspected) exposure to covid-19: Secondary | ICD-10-CM | POA: Diagnosis not present

## 2019-12-16 DIAGNOSIS — Z20822 Contact with and (suspected) exposure to covid-19: Secondary | ICD-10-CM | POA: Diagnosis not present

## 2020-01-08 IMAGING — US US ABDOMEN LIMITED
1 series · 14 of 25 positions shown · non-contrast
Comparison: CT 07/21/2017

CLINICAL DATA: Upper abdominal pain

EXAM:
ULTRASOUND ABDOMEN LIMITED RIGHT UPPER QUADRANT

[Series 1: us abdomen limited · 0.25mm/px · 14 of 51 slices shown]
[im 1/51]
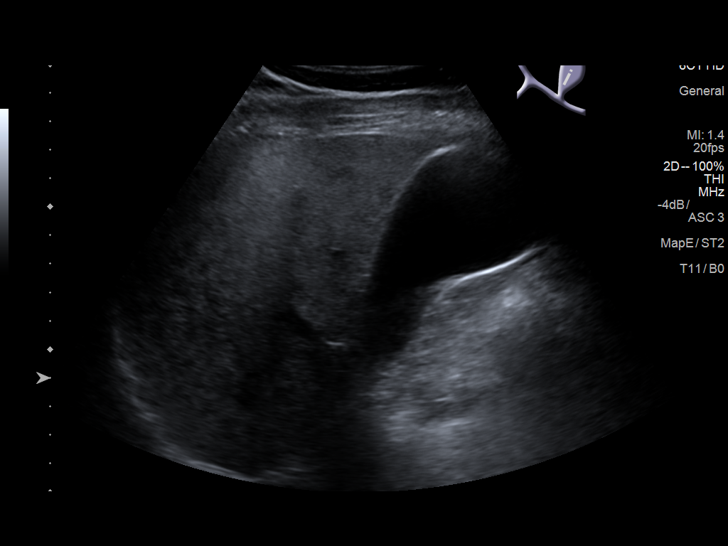
[im 5/51]
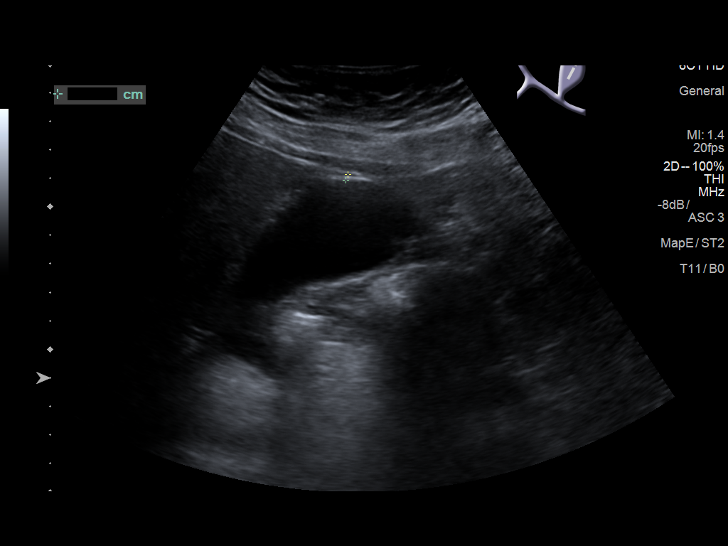
[im 9/51]
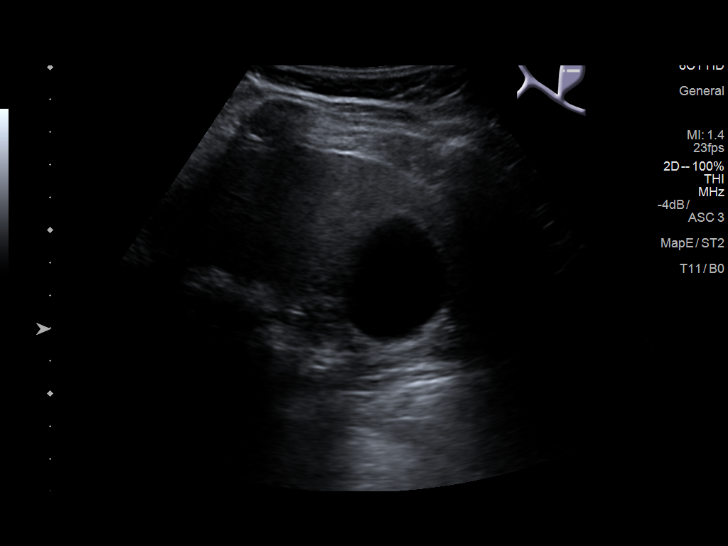
[im 13/51]
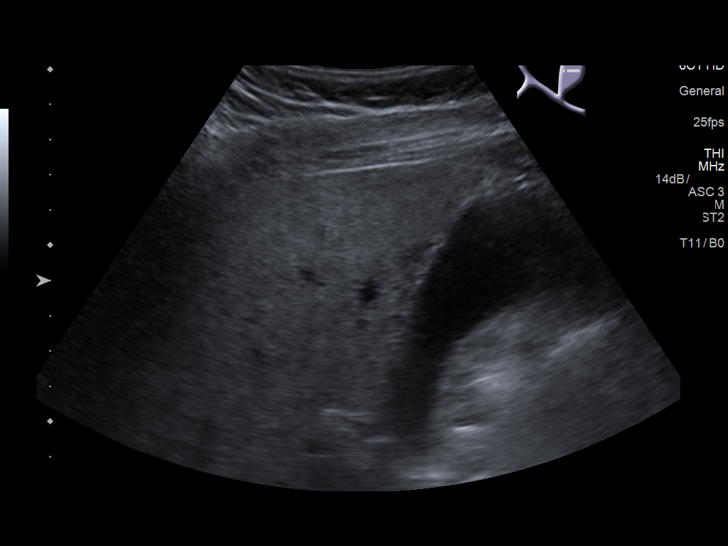
[im 17/51]
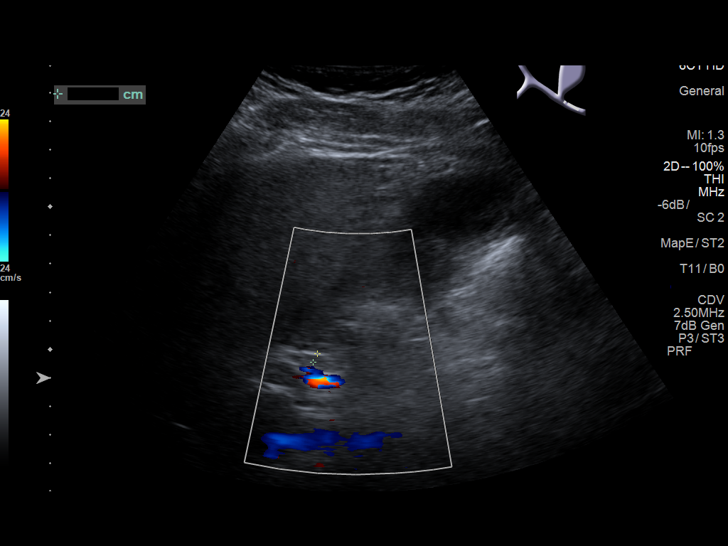
[im 19/51]
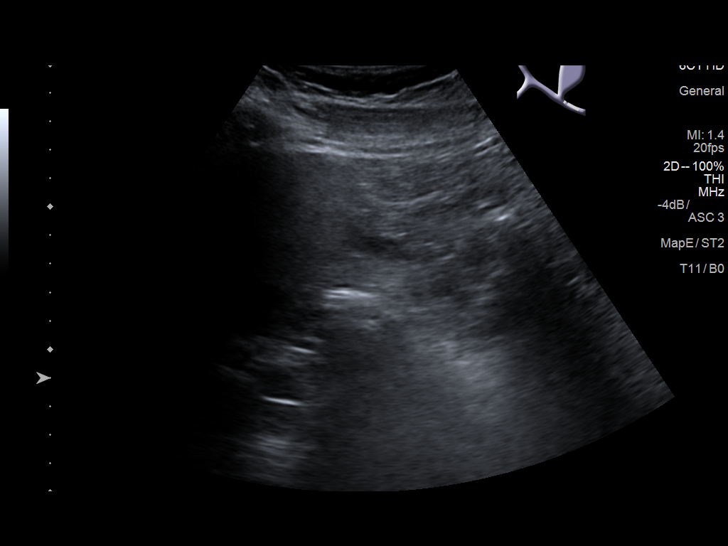
[im 23/51]
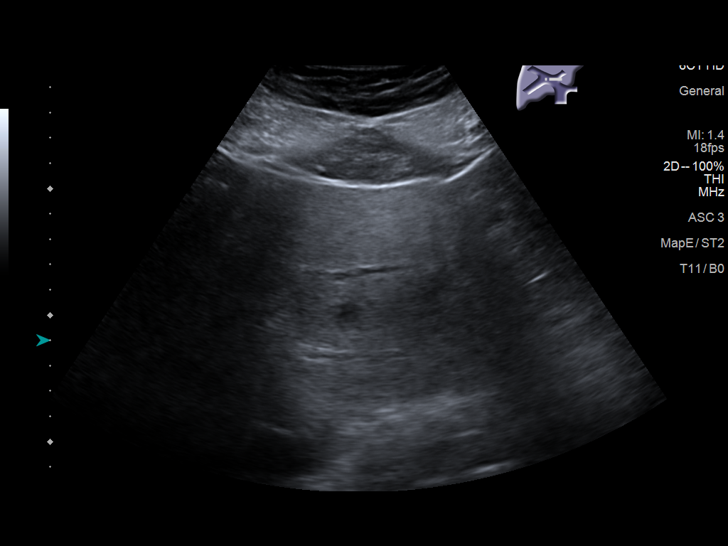
[im 28/51]
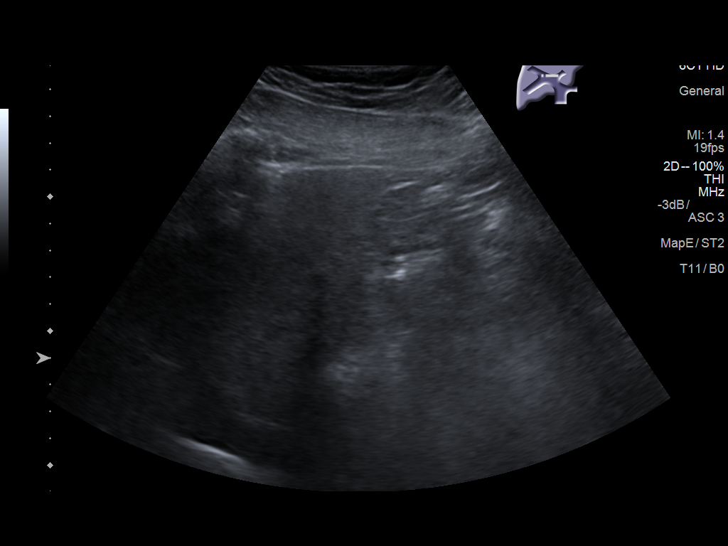
[im 32/51]
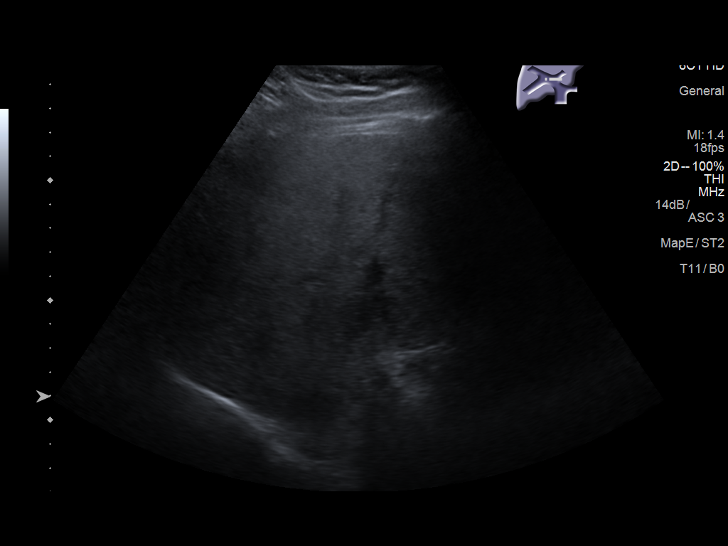
[im 34/51]
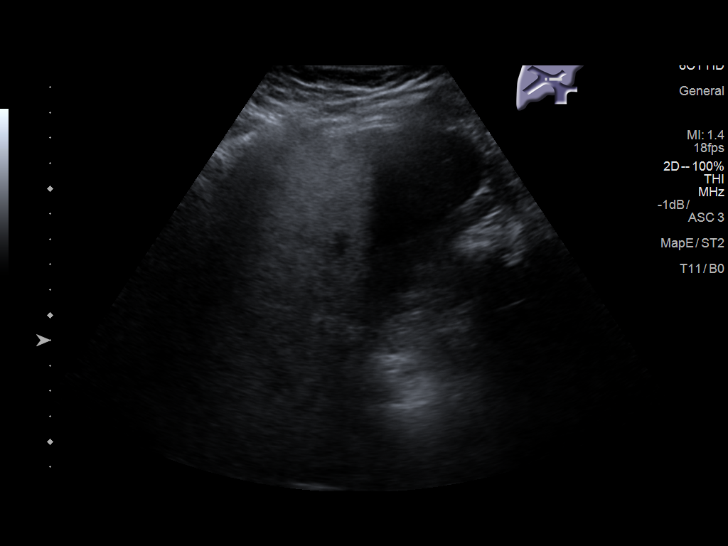
[im 38/51]
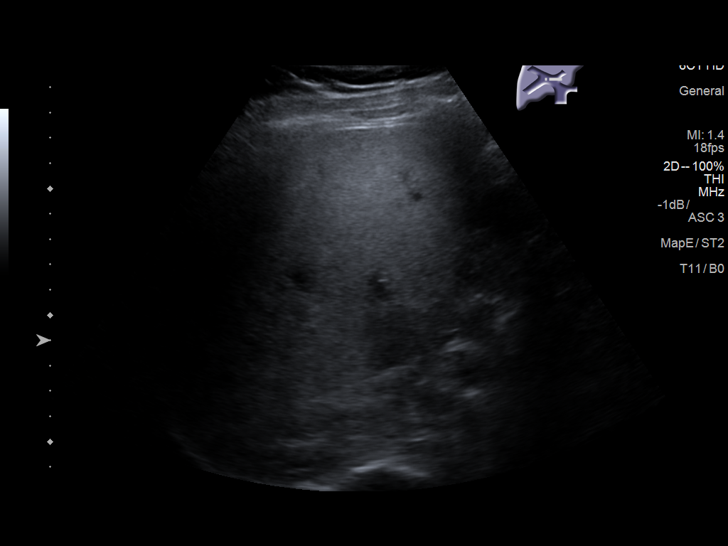
[im 42/51]
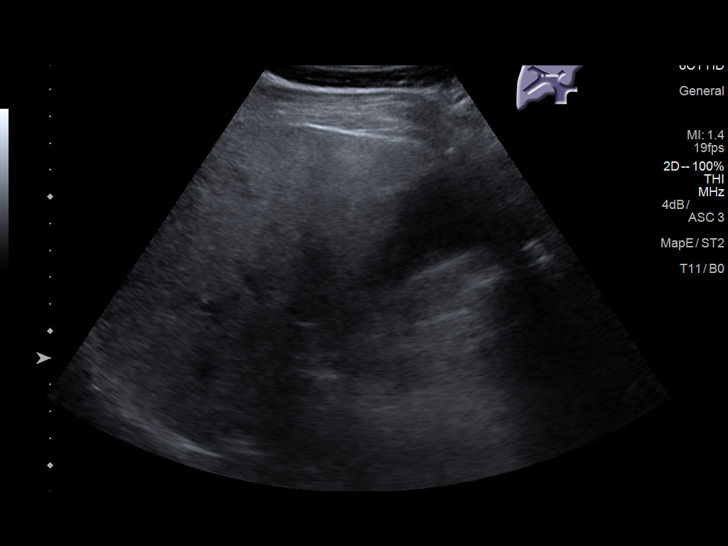
[im 46/51]
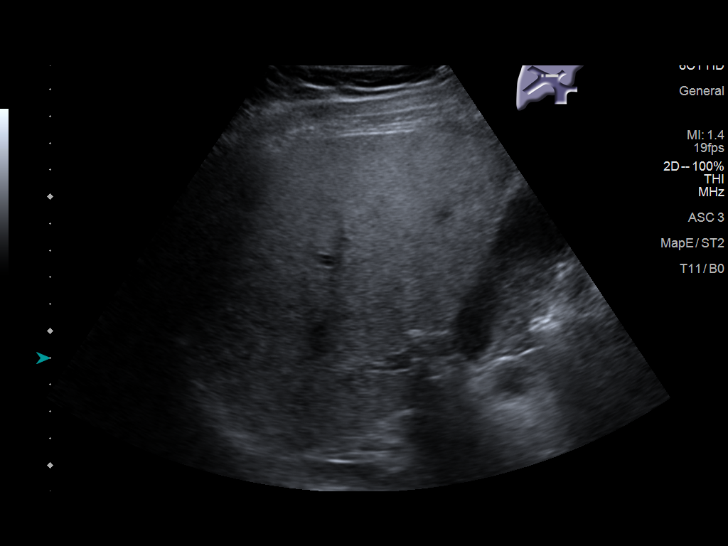
[im 51/51]
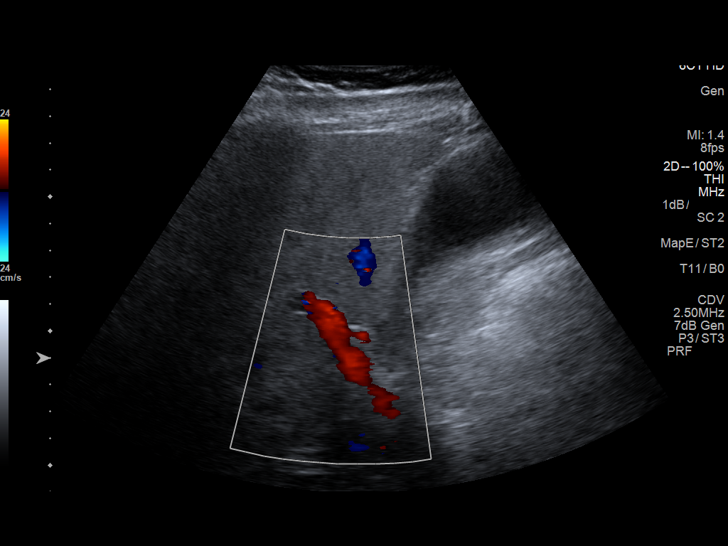

[14 of 25 positions shown; findings below may reference images not displayed]

FINDINGS: Gallbladder:

No gallstones or wall thickening visualized. No sonographic Murphy
sign noted by sonographer.

Common bile duct:

Diameter: 3.3 mm

Liver:

Heterogeneous increased echogenicity. Hypoechoic area adjacent to
the gallbladder, likely focus of fatty sparing. Portal vein is
patent on color Doppler imaging with normal direction of blood flow
towards the liver.
IMPRESSION: 1. Negative for gallstones or cholecystitis. Negative for biliary
dilatation
2. Heterogeneous fat infiltration of the liver with probable sparing
near the gallbladder fossa

## 2020-01-21 DIAGNOSIS — H524 Presbyopia: Secondary | ICD-10-CM | POA: Diagnosis not present

## 2020-03-19 DIAGNOSIS — R7303 Prediabetes: Secondary | ICD-10-CM | POA: Diagnosis not present

## 2020-03-19 DIAGNOSIS — E785 Hyperlipidemia, unspecified: Secondary | ICD-10-CM | POA: Diagnosis not present

## 2020-05-07 DIAGNOSIS — Z20822 Contact with and (suspected) exposure to covid-19: Secondary | ICD-10-CM | POA: Diagnosis not present

## 2020-09-06 DIAGNOSIS — Z1152 Encounter for screening for COVID-19: Secondary | ICD-10-CM | POA: Diagnosis not present

## 2020-09-06 DIAGNOSIS — Z20822 Contact with and (suspected) exposure to covid-19: Secondary | ICD-10-CM | POA: Diagnosis not present

## 2020-09-06 DIAGNOSIS — J029 Acute pharyngitis, unspecified: Secondary | ICD-10-CM | POA: Diagnosis not present

## 2020-09-06 DIAGNOSIS — J020 Streptococcal pharyngitis: Secondary | ICD-10-CM | POA: Diagnosis not present

## 2020-09-28 DIAGNOSIS — Z1152 Encounter for screening for COVID-19: Secondary | ICD-10-CM | POA: Diagnosis not present

## 2020-09-28 DIAGNOSIS — Z20822 Contact with and (suspected) exposure to covid-19: Secondary | ICD-10-CM | POA: Diagnosis not present

## 2020-09-28 DIAGNOSIS — J020 Streptococcal pharyngitis: Secondary | ICD-10-CM | POA: Diagnosis not present

## 2020-10-13 ENCOUNTER — Encounter (HOSPITAL_COMMUNITY): Payer: Self-pay

## 2020-10-13 ENCOUNTER — Emergency Department (HOSPITAL_COMMUNITY)
Admission: EM | Admit: 2020-10-13 | Discharge: 2020-10-14 | Disposition: A | Discharge status: Home / Self Care | Payer: Medicare Other | Attending: Emergency Medicine | Admitting: Emergency Medicine

## 2020-10-13 ENCOUNTER — Other Ambulatory Visit: Payer: Self-pay

## 2020-10-13 DIAGNOSIS — R42 Dizziness and giddiness: Secondary | ICD-10-CM | POA: Diagnosis not present

## 2020-10-13 DIAGNOSIS — I951 Orthostatic hypotension: Secondary | ICD-10-CM | POA: Diagnosis not present

## 2020-10-13 DIAGNOSIS — Z8616 Personal history of COVID-19: Secondary | ICD-10-CM | POA: Insufficient documentation

## 2020-10-13 LAB — BASIC METABOLIC PANEL
Anion gap: 5 (ref 5–15)
BUN: 11 mg/dL (ref 6–20)
CO2: 27 mmol/L (ref 22–32)
Calcium: 9.2 mg/dL (ref 8.9–10.3)
Chloride: 106 mmol/L (ref 98–111)
Creatinine, Ser: 1 mg/dL (ref 0.61–1.24)
GFR, Estimated: >60 mL/min (ref >60)
Glucose, Bld: 108 mg/dL — ABNORMAL HIGH (ref 70–99)
Potassium: 3.5 mmol/L (ref 3.5–5.1)
Sodium: 138 mmol/L (ref 135–145)

## 2020-10-13 LAB — URINALYSIS, ROUTINE W REFLEX MICROSCOPIC
Bilirubin Urine: NEGATIVE
Glucose, UA: NEGATIVE mg/dL
Hgb urine dipstick: NEGATIVE
Ketones, ur: NEGATIVE mg/dL
Leukocytes,Ua: NEGATIVE
Nitrite: NEGATIVE
Protein, ur: NEGATIVE mg/dL
Specific Gravity, Urine: 1.004 — ABNORMAL LOW (ref 1.005–1.030)
pH: 6 (ref 5.0–8.0)

## 2020-10-13 LAB — CBC
HCT: 41.2 % (ref 39.0–52.0)
Hemoglobin: 13.2 g/dL (ref 13.0–17.0)
MCH: 27.7 pg (ref 26.0–34.0)
MCHC: 32 g/dL (ref 30.0–36.0)
MCV: 86.6 fL (ref 80.0–100.0)
Platelets: 279 10*3/uL (ref 150–400)
RBC: 4.76 MIL/uL (ref 4.22–5.81)
RDW: 14.2 % (ref 11.5–15.5)
WBC: 7.6 10*3/uL (ref 4.0–10.5)
nRBC: 0 % (ref 0.0–0.2)

## 2020-10-13 MED ORDER — MECLIZINE HCL 25 MG PO TABS: 25.0000 mg | ORAL_TABLET | Freq: Three times a day (TID) | ORAL | 0 refills | Status: AC | PRN

## 2020-10-13 MED ORDER — SODIUM CHLORIDE 0.9 % IV BOLUS
1000.0000 mL | Freq: Once | INTRAVENOUS | Status: AC
  Administered 2020-10-13: 1000 mL via INTRAVENOUS

## 2020-10-13 NOTE — ED Notes (Signed)
Pt reported dizziness when standing during orthostatic vitals.

## 2020-10-13 NOTE — Discharge Instructions (Signed)
Stay hydrated.  Take meclizine as needed for lightheadedness.  Return if you have any concerns

## 2020-10-13 NOTE — ED Triage Notes (Signed)
Patient reports positional dizziness and nausea x 3 days, states he feels like he is dehydrated.
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
## 2020-10-16 DIAGNOSIS — H8110 Benign paroxysmal vertigo, unspecified ear: Secondary | ICD-10-CM | POA: Diagnosis not present

## 2020-11-08 DIAGNOSIS — J020 Streptococcal pharyngitis: Secondary | ICD-10-CM | POA: Diagnosis not present

## 2020-11-08 DIAGNOSIS — R059 Cough, unspecified: Secondary | ICD-10-CM | POA: Diagnosis not present

## 2020-11-08 DIAGNOSIS — J029 Acute pharyngitis, unspecified: Secondary | ICD-10-CM | POA: Diagnosis not present

## 2020-11-08 DIAGNOSIS — Z20822 Contact with and (suspected) exposure to covid-19: Secondary | ICD-10-CM | POA: Diagnosis not present

## 2020-12-28 DIAGNOSIS — E785 Hyperlipidemia, unspecified: Secondary | ICD-10-CM | POA: Diagnosis not present

## 2020-12-28 DIAGNOSIS — R7303 Prediabetes: Secondary | ICD-10-CM | POA: Diagnosis not present

## 2020-12-28 DIAGNOSIS — Z1211 Encounter for screening for malignant neoplasm of colon: Secondary | ICD-10-CM | POA: Diagnosis not present

## 2020-12-28 DIAGNOSIS — Z0000 Encounter for general adult medical examination without abnormal findings: Secondary | ICD-10-CM | POA: Diagnosis not present

## 2021-01-03 DIAGNOSIS — R197 Diarrhea, unspecified: Secondary | ICD-10-CM | POA: Diagnosis not present

## 2021-01-03 DIAGNOSIS — Z5321 Procedure and treatment not carried out due to patient leaving prior to being seen by health care provider: Secondary | ICD-10-CM | POA: Diagnosis not present

## 2021-01-03 DIAGNOSIS — R531 Weakness: Secondary | ICD-10-CM | POA: Diagnosis not present

## 2021-01-03 DIAGNOSIS — R109 Unspecified abdominal pain: Secondary | ICD-10-CM | POA: Diagnosis not present

## 2021-03-27 DIAGNOSIS — Z139 Encounter for screening, unspecified: Secondary | ICD-10-CM | POA: Diagnosis not present

## 2021-03-27 DIAGNOSIS — Z136 Encounter for screening for cardiovascular disorders: Secondary | ICD-10-CM | POA: Diagnosis not present

## 2021-03-27 DIAGNOSIS — Z0000 Encounter for general adult medical examination without abnormal findings: Secondary | ICD-10-CM | POA: Diagnosis not present

## 2021-04-15 DIAGNOSIS — K1120 Sialoadenitis, unspecified: Secondary | ICD-10-CM | POA: Diagnosis not present

## 2021-04-15 DIAGNOSIS — J029 Acute pharyngitis, unspecified: Secondary | ICD-10-CM | POA: Diagnosis not present

## 2021-04-15 DIAGNOSIS — Z20822 Contact with and (suspected) exposure to covid-19: Secondary | ICD-10-CM | POA: Diagnosis not present

## 2021-04-15 DIAGNOSIS — R509 Fever, unspecified: Secondary | ICD-10-CM | POA: Diagnosis not present

## 2021-04-15 DIAGNOSIS — R059 Cough, unspecified: Secondary | ICD-10-CM | POA: Diagnosis not present

## 2021-04-18 DIAGNOSIS — K1120 Sialoadenitis, unspecified: Secondary | ICD-10-CM | POA: Diagnosis not present

## 2021-04-18 DIAGNOSIS — R0989 Other specified symptoms and signs involving the circulatory and respiratory systems: Secondary | ICD-10-CM | POA: Diagnosis not present

## 2021-07-02 DIAGNOSIS — E785 Hyperlipidemia, unspecified: Secondary | ICD-10-CM | POA: Diagnosis not present

## 2021-07-02 DIAGNOSIS — R7303 Prediabetes: Secondary | ICD-10-CM | POA: Diagnosis not present

## 2021-07-08 DIAGNOSIS — E785 Hyperlipidemia, unspecified: Secondary | ICD-10-CM | POA: Diagnosis not present

## 2021-07-08 DIAGNOSIS — Z1152 Encounter for screening for COVID-19: Secondary | ICD-10-CM | POA: Diagnosis not present

## 2021-07-08 DIAGNOSIS — R7303 Prediabetes: Secondary | ICD-10-CM | POA: Diagnosis not present

## 2021-07-08 DIAGNOSIS — J020 Streptococcal pharyngitis: Secondary | ICD-10-CM | POA: Diagnosis not present

## 2021-07-08 DIAGNOSIS — Z20822 Contact with and (suspected) exposure to covid-19: Secondary | ICD-10-CM | POA: Diagnosis not present

## 2021-12-02 DIAGNOSIS — J029 Acute pharyngitis, unspecified: Secondary | ICD-10-CM | POA: Diagnosis not present

## 2021-12-02 DIAGNOSIS — J3089 Other allergic rhinitis: Secondary | ICD-10-CM | POA: Diagnosis not present

## 2021-12-02 DIAGNOSIS — Z20822 Contact with and (suspected) exposure to covid-19: Secondary | ICD-10-CM | POA: Diagnosis not present

## 2021-12-26 DIAGNOSIS — E785 Hyperlipidemia, unspecified: Secondary | ICD-10-CM | POA: Diagnosis not present

## 2021-12-26 DIAGNOSIS — R7303 Prediabetes: Secondary | ICD-10-CM | POA: Diagnosis not present

## 2021-12-30 DIAGNOSIS — E785 Hyperlipidemia, unspecified: Secondary | ICD-10-CM | POA: Diagnosis not present

## 2021-12-30 DIAGNOSIS — Z139 Encounter for screening, unspecified: Secondary | ICD-10-CM | POA: Diagnosis not present

## 2021-12-30 DIAGNOSIS — M5020 Other cervical disc displacement, unspecified cervical region: Secondary | ICD-10-CM | POA: Diagnosis not present

## 2021-12-30 DIAGNOSIS — R7303 Prediabetes: Secondary | ICD-10-CM | POA: Diagnosis not present

## 2022-01-31 DIAGNOSIS — Z1211 Encounter for screening for malignant neoplasm of colon: Secondary | ICD-10-CM | POA: Diagnosis not present

## 2022-01-31 DIAGNOSIS — R1030 Lower abdominal pain, unspecified: Secondary | ICD-10-CM | POA: Diagnosis not present

## 2022-02-18 DIAGNOSIS — Z1211 Encounter for screening for malignant neoplasm of colon: Secondary | ICD-10-CM | POA: Diagnosis not present

## 2022-02-18 DIAGNOSIS — K589 Irritable bowel syndrome without diarrhea: Secondary | ICD-10-CM | POA: Diagnosis not present

## 2022-03-26 DIAGNOSIS — Z1211 Encounter for screening for malignant neoplasm of colon: Secondary | ICD-10-CM | POA: Diagnosis not present

## 2022-05-13 DIAGNOSIS — S0501XA Injury of conjunctiva and corneal abrasion without foreign body, right eye, initial encounter: Secondary | ICD-10-CM | POA: Diagnosis not present

## 2022-05-13 DIAGNOSIS — H5711 Ocular pain, right eye: Secondary | ICD-10-CM | POA: Diagnosis not present

## 2022-05-13 DIAGNOSIS — R0981 Nasal congestion: Secondary | ICD-10-CM | POA: Diagnosis not present

## 2022-05-20 DIAGNOSIS — Z136 Encounter for screening for cardiovascular disorders: Secondary | ICD-10-CM | POA: Diagnosis not present

## 2022-05-20 DIAGNOSIS — Z139 Encounter for screening, unspecified: Secondary | ICD-10-CM | POA: Diagnosis not present

## 2022-05-20 DIAGNOSIS — Z0000 Encounter for general adult medical examination without abnormal findings: Secondary | ICD-10-CM | POA: Diagnosis not present

## 2022-05-20 DIAGNOSIS — S0501XD Injury of conjunctiva and corneal abrasion without foreign body, right eye, subsequent encounter: Secondary | ICD-10-CM | POA: Diagnosis not present

## 2022-06-16 DIAGNOSIS — E785 Hyperlipidemia, unspecified: Secondary | ICD-10-CM | POA: Diagnosis not present

## 2022-06-16 DIAGNOSIS — R7303 Prediabetes: Secondary | ICD-10-CM | POA: Diagnosis not present

## 2022-06-23 DIAGNOSIS — E785 Hyperlipidemia, unspecified: Secondary | ICD-10-CM | POA: Diagnosis not present

## 2022-06-23 DIAGNOSIS — M1711 Unilateral primary osteoarthritis, right knee: Secondary | ICD-10-CM | POA: Diagnosis not present

## 2022-06-23 DIAGNOSIS — R7303 Prediabetes: Secondary | ICD-10-CM | POA: Diagnosis not present

## 2022-08-12 DIAGNOSIS — W57XXXA Bitten or stung by nonvenomous insect and other nonvenomous arthropods, initial encounter: Secondary | ICD-10-CM | POA: Diagnosis not present

## 2022-08-12 DIAGNOSIS — L309 Dermatitis, unspecified: Secondary | ICD-10-CM | POA: Diagnosis not present

## 2023-03-20 DIAGNOSIS — L255 Unspecified contact dermatitis due to plants, except food: Secondary | ICD-10-CM | POA: Diagnosis not present

## 2023-04-14 DIAGNOSIS — B085 Enteroviral vesicular pharyngitis: Secondary | ICD-10-CM | POA: Diagnosis not present

## 2023-04-14 DIAGNOSIS — Z6832 Body mass index (BMI) 32.0-32.9, adult: Secondary | ICD-10-CM | POA: Diagnosis not present

## 2023-04-14 DIAGNOSIS — J029 Acute pharyngitis, unspecified: Secondary | ICD-10-CM | POA: Diagnosis not present

## 2023-04-14 DIAGNOSIS — Z20822 Contact with and (suspected) exposure to covid-19: Secondary | ICD-10-CM | POA: Diagnosis not present

## 2023-05-01 DIAGNOSIS — R519 Headache, unspecified: Secondary | ICD-10-CM | POA: Diagnosis not present

## 2023-05-01 DIAGNOSIS — Z6833 Body mass index (BMI) 33.0-33.9, adult: Secondary | ICD-10-CM | POA: Diagnosis not present

## 2023-05-01 DIAGNOSIS — R030 Elevated blood-pressure reading, without diagnosis of hypertension: Secondary | ICD-10-CM | POA: Diagnosis not present

## 2023-05-22 DIAGNOSIS — Z139 Encounter for screening, unspecified: Secondary | ICD-10-CM | POA: Diagnosis not present

## 2023-05-22 DIAGNOSIS — Z136 Encounter for screening for cardiovascular disorders: Secondary | ICD-10-CM | POA: Diagnosis not present

## 2023-05-22 DIAGNOSIS — Z1389 Encounter for screening for other disorder: Secondary | ICD-10-CM | POA: Diagnosis not present

## 2023-05-22 DIAGNOSIS — Z0000 Encounter for general adult medical examination without abnormal findings: Secondary | ICD-10-CM | POA: Diagnosis not present

## 2023-05-22 DIAGNOSIS — R030 Elevated blood-pressure reading, without diagnosis of hypertension: Secondary | ICD-10-CM | POA: Diagnosis not present

## 2023-05-22 DIAGNOSIS — Z1331 Encounter for screening for depression: Secondary | ICD-10-CM | POA: Diagnosis not present

## 2023-05-22 DIAGNOSIS — Z6833 Body mass index (BMI) 33.0-33.9, adult: Secondary | ICD-10-CM | POA: Diagnosis not present

## 2023-05-22 DIAGNOSIS — Z1339 Encounter for screening examination for other mental health and behavioral disorders: Secondary | ICD-10-CM | POA: Diagnosis not present

## 2023-05-22 DIAGNOSIS — Z0189 Encounter for other specified special examinations: Secondary | ICD-10-CM | POA: Diagnosis not present

## 2023-08-14 DIAGNOSIS — Z6833 Body mass index (BMI) 33.0-33.9, adult: Secondary | ICD-10-CM | POA: Diagnosis not present

## 2023-08-14 DIAGNOSIS — E785 Hyperlipidemia, unspecified: Secondary | ICD-10-CM | POA: Diagnosis not present

## 2023-08-14 DIAGNOSIS — H538 Other visual disturbances: Secondary | ICD-10-CM | POA: Diagnosis not present

## 2023-08-14 DIAGNOSIS — Z125 Encounter for screening for malignant neoplasm of prostate: Secondary | ICD-10-CM | POA: Diagnosis not present

## 2023-08-14 DIAGNOSIS — R7303 Prediabetes: Secondary | ICD-10-CM | POA: Diagnosis not present

## 2023-09-04 DIAGNOSIS — Z6832 Body mass index (BMI) 32.0-32.9, adult: Secondary | ICD-10-CM | POA: Diagnosis not present

## 2023-09-04 DIAGNOSIS — E785 Hyperlipidemia, unspecified: Secondary | ICD-10-CM | POA: Diagnosis not present

## 2023-09-04 DIAGNOSIS — R7303 Prediabetes: Secondary | ICD-10-CM | POA: Diagnosis not present

## 2023-10-01 DIAGNOSIS — R1030 Lower abdominal pain, unspecified: Secondary | ICD-10-CM | POA: Diagnosis not present

## 2023-10-01 DIAGNOSIS — K5900 Constipation, unspecified: Secondary | ICD-10-CM | POA: Diagnosis not present

## 2023-10-01 DIAGNOSIS — Z6832 Body mass index (BMI) 32.0-32.9, adult: Secondary | ICD-10-CM | POA: Diagnosis not present

## 2024-03-04 DIAGNOSIS — R7303 Prediabetes: Secondary | ICD-10-CM | POA: Diagnosis not present

## 2024-03-04 DIAGNOSIS — Z125 Encounter for screening for malignant neoplasm of prostate: Secondary | ICD-10-CM | POA: Diagnosis not present

## 2024-03-04 DIAGNOSIS — E785 Hyperlipidemia, unspecified: Secondary | ICD-10-CM | POA: Diagnosis not present

## 2024-06-05 ENCOUNTER — Encounter (HOSPITAL_BASED_OUTPATIENT_CLINIC_OR_DEPARTMENT_OTHER): Payer: Self-pay | Admitting: Emergency Medicine

## 2024-06-05 ENCOUNTER — Emergency Department (HOSPITAL_BASED_OUTPATIENT_CLINIC_OR_DEPARTMENT_OTHER)
Admission: EM | Admit: 2024-06-05 | Discharge: 2024-06-05 | Disposition: A | Discharge status: Home / Self Care | Attending: Emergency Medicine | Admitting: Emergency Medicine

## 2024-06-05 ENCOUNTER — Emergency Department (HOSPITAL_BASED_OUTPATIENT_CLINIC_OR_DEPARTMENT_OTHER)

## 2024-06-05 ENCOUNTER — Other Ambulatory Visit: Payer: Self-pay

## 2024-06-05 DIAGNOSIS — J069 Acute upper respiratory infection, unspecified: Secondary | ICD-10-CM | POA: Insufficient documentation

## 2024-06-05 DIAGNOSIS — R059 Cough, unspecified: Secondary | ICD-10-CM | POA: Diagnosis present

## 2024-06-05 DIAGNOSIS — Z8616 Personal history of COVID-19: Secondary | ICD-10-CM | POA: Insufficient documentation

## 2024-06-05 LAB — RESP PANEL BY RT-PCR (RSV, FLU A&B, COVID)  RVPGX2
Influenza A by PCR: NEGATIVE
Influenza B by PCR: NEGATIVE
Resp Syncytial Virus by PCR: NEGATIVE
SARS Coronavirus 2 by RT PCR: NEGATIVE

## 2024-06-05 MED ORDER — FLUTICASONE PROPIONATE 50 MCG/ACT NA SUSP: 2.0000 | Freq: Every day | NASAL | 0 refills | Status: AC

## 2024-06-05 MED ORDER — ACETAMINOPHEN 325 MG PO TABS
650.0000 mg | ORAL_TABLET | Freq: Once | ORAL | Status: AC
  Administered 2024-06-05: 650 mg via ORAL
  Filled 2024-06-05: qty 2

## 2024-06-05 MED ORDER — BENZONATATE 100 MG PO CAPS: 100.0000 mg | ORAL_CAPSULE | Freq: Three times a day (TID) | ORAL | 0 refills | Status: AC | PRN
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
## 2024-06-05 NOTE — Discharge Instructions (Signed)
 We believe your persistent cough is a result of a viral syndrome for which your symptoms have still not quite resolved.  Sometimes it takes many weeks to completely go away, especially if you smoke or have chronic lung problems.  Please take any medications prescribed and follow up as recommended with your regular doctor.  If you develop any new or worsening symptoms, including but not limited to fever, persistent vomiting, worsening shortness of breath, or other symptoms that concern you, please return to the Emergency Department immediately.

## 2024-06-05 NOTE — ED Triage Notes (Signed)
 Pt c/o cough and congestion x 1wk; reports fever today
